# Patient Record
Sex: Female | Born: 1965 | Race: White | Hispanic: No | Marital: Single | Smoking: Never smoker
Health system: Southern US, Community
[De-identification: ages and names within clinical notes are randomized; demographics above are authoritative.]

## PROBLEM LIST (undated history)

## (undated) DIAGNOSIS — R0600 Dyspnea, unspecified: Secondary | ICD-10-CM

## (undated) DIAGNOSIS — N939 Abnormal uterine and vaginal bleeding, unspecified: Secondary | ICD-10-CM

## (undated) DIAGNOSIS — F32A Depression, unspecified: Secondary | ICD-10-CM

## (undated) DIAGNOSIS — J45909 Unspecified asthma, uncomplicated: Secondary | ICD-10-CM

## (undated) DIAGNOSIS — D649 Anemia, unspecified: Secondary | ICD-10-CM

## (undated) DIAGNOSIS — E1169 Type 2 diabetes mellitus with other specified complication: Secondary | ICD-10-CM

## (undated) DIAGNOSIS — F419 Anxiety disorder, unspecified: Secondary | ICD-10-CM

## (undated) DIAGNOSIS — M1990 Unspecified osteoarthritis, unspecified site: Secondary | ICD-10-CM

## (undated) DIAGNOSIS — R7303 Prediabetes: Secondary | ICD-10-CM

## (undated) DIAGNOSIS — E669 Obesity, unspecified: Secondary | ICD-10-CM

## (undated) HISTORY — DX: Obesity, unspecified: E66.9

## (undated) HISTORY — DX: Unspecified asthma, uncomplicated: J45.909

## (undated) HISTORY — DX: Obesity, unspecified: E11.69

---

## 1998-07-05 ENCOUNTER — Inpatient Hospital Stay (HOSPITAL_COMMUNITY): Admission: AD | Admit: 1998-07-05 | Discharge: 1998-07-08 | Payer: Self-pay | Admitting: Obstetrics and Gynecology

## 2004-12-09 ENCOUNTER — Other Ambulatory Visit: Admission: RE | Admit: 2004-12-09 | Discharge: 2004-12-09 | Payer: Self-pay | Admitting: Family Medicine

## 2005-12-18 ENCOUNTER — Other Ambulatory Visit: Admission: RE | Admit: 2005-12-18 | Discharge: 2005-12-18 | Payer: Self-pay | Admitting: Family Medicine

## 2006-08-14 ENCOUNTER — Encounter: Admission: RE | Admit: 2006-08-14 | Discharge: 2006-08-14 | Payer: Self-pay | Admitting: Family Medicine

## 2006-12-21 ENCOUNTER — Other Ambulatory Visit: Admission: RE | Admit: 2006-12-21 | Discharge: 2006-12-21 | Payer: Self-pay | Admitting: Family Medicine

## 2007-09-24 ENCOUNTER — Encounter: Admission: RE | Admit: 2007-09-24 | Discharge: 2007-09-24 | Payer: Self-pay | Admitting: Family Medicine

## 2008-02-03 ENCOUNTER — Other Ambulatory Visit: Admission: RE | Admit: 2008-02-03 | Discharge: 2008-02-03 | Payer: Self-pay | Admitting: Family Medicine

## 2009-08-06 ENCOUNTER — Other Ambulatory Visit: Admission: RE | Admit: 2009-08-06 | Discharge: 2009-08-06 | Payer: Self-pay | Admitting: Family Medicine

## 2009-09-28 ENCOUNTER — Encounter: Admission: RE | Admit: 2009-09-28 | Discharge: 2009-09-28 | Payer: Self-pay | Admitting: Family Medicine

## 2010-03-21 ENCOUNTER — Ambulatory Visit
Admission: RE | Admit: 2010-03-21 | Discharge: 2010-03-21 | Disposition: A | Discharge status: Home / Self Care | Payer: BC Managed Care – PPO | Source: Ambulatory Visit | Attending: Family Medicine | Admitting: Family Medicine

## 2010-03-21 ENCOUNTER — Other Ambulatory Visit: Payer: Self-pay | Admitting: Family Medicine

## 2010-03-21 DIAGNOSIS — R059 Cough, unspecified: Secondary | ICD-10-CM

## 2010-03-21 DIAGNOSIS — R05 Cough: Secondary | ICD-10-CM

## 2010-08-12 ENCOUNTER — Other Ambulatory Visit: Payer: Self-pay | Admitting: Family Medicine

## 2010-08-12 ENCOUNTER — Other Ambulatory Visit (HOSPITAL_COMMUNITY)
Admission: RE | Admit: 2010-08-12 | Discharge: 2010-08-12 | Disposition: A | Discharge status: Home / Self Care | Payer: BC Managed Care – PPO | Source: Ambulatory Visit | Attending: Family Medicine | Admitting: Family Medicine

## 2010-08-12 DIAGNOSIS — Z124 Encounter for screening for malignant neoplasm of cervix: Secondary | ICD-10-CM | POA: Insufficient documentation

## 2010-12-17 ENCOUNTER — Other Ambulatory Visit: Payer: Self-pay | Admitting: Family Medicine

## 2010-12-17 DIAGNOSIS — Z1231 Encounter for screening mammogram for malignant neoplasm of breast: Secondary | ICD-10-CM

## 2011-01-09 ENCOUNTER — Ambulatory Visit
Admission: RE | Admit: 2011-01-09 | Discharge: 2011-01-09 | Disposition: A | Discharge status: Home / Self Care | Payer: BC Managed Care – PPO | Source: Ambulatory Visit | Attending: Family Medicine | Admitting: Family Medicine

## 2011-01-09 DIAGNOSIS — Z1231 Encounter for screening mammogram for malignant neoplasm of breast: Secondary | ICD-10-CM

## 2012-08-10 ENCOUNTER — Other Ambulatory Visit: Payer: Self-pay | Admitting: Obstetrics and Gynecology

## 2012-08-10 ENCOUNTER — Other Ambulatory Visit (HOSPITAL_COMMUNITY)
Admission: RE | Admit: 2012-08-10 | Discharge: 2012-08-10 | Disposition: A | Discharge status: Home / Self Care | Payer: BC Managed Care – PPO | Source: Ambulatory Visit | Attending: Obstetrics and Gynecology | Admitting: Obstetrics and Gynecology

## 2012-08-10 DIAGNOSIS — Z01419 Encounter for gynecological examination (general) (routine) without abnormal findings: Secondary | ICD-10-CM | POA: Insufficient documentation

## 2012-08-10 DIAGNOSIS — Z1151 Encounter for screening for human papillomavirus (HPV): Secondary | ICD-10-CM | POA: Insufficient documentation

## 2012-09-09 ENCOUNTER — Other Ambulatory Visit: Payer: Self-pay

## 2012-09-09 DIAGNOSIS — Z1231 Encounter for screening mammogram for malignant neoplasm of breast: Secondary | ICD-10-CM

## 2012-10-01 ENCOUNTER — Ambulatory Visit: Payer: BC Managed Care – PPO

## 2012-10-20 ENCOUNTER — Ambulatory Visit
Admission: RE | Admit: 2012-10-20 | Discharge: 2012-10-20 | Disposition: A | Discharge status: Home / Self Care | Payer: BC Managed Care – PPO | Source: Ambulatory Visit

## 2012-10-20 DIAGNOSIS — Z1231 Encounter for screening mammogram for malignant neoplasm of breast: Secondary | ICD-10-CM

## 2013-10-24 ENCOUNTER — Other Ambulatory Visit (HOSPITAL_COMMUNITY): Payer: Self-pay | Admitting: Respiratory Therapy

## 2013-10-24 ENCOUNTER — Ambulatory Visit (HOSPITAL_COMMUNITY)
Admission: RE | Admit: 2013-10-24 | Discharge: 2013-10-24 | Disposition: A | Discharge status: Home / Self Care | Payer: BC Managed Care – PPO | Source: Ambulatory Visit | Attending: Family Medicine | Admitting: Family Medicine

## 2013-10-24 DIAGNOSIS — R0600 Dyspnea, unspecified: Secondary | ICD-10-CM | POA: Insufficient documentation

## 2013-10-24 DIAGNOSIS — J45909 Unspecified asthma, uncomplicated: Secondary | ICD-10-CM

## 2013-10-24 LAB — PULMONARY FUNCTION TEST
DL/VA % PRED: 117 %
DL/VA: 5.5 ml/min/mmHg/L
DLCO COR % PRED: 105 %
DLCO COR: 24.13 ml/min/mmHg
DLCO unc % pred: 105 %
DLCO unc: 24.13 ml/min/mmHg
FEF 25-75 PRE: 1.95 L/s
FEF 25-75 Post: 2.03 L/sec
FEF2575-%CHANGE-POST: 4 %
FEF2575-%PRED-POST: 71 %
FEF2575-%PRED-PRE: 69 %
FEV1-%CHANGE-POST: 2 %
FEV1-%PRED-PRE: 87 %
FEV1-%Pred-Post: 89 %
FEV1-PRE: 2.43 L
FEV1-Post: 2.48 L
FEV1FVC-%Change-Post: 5 %
FEV1FVC-%PRED-PRE: 94 %
FEV6-%CHANGE-POST: 0 %
FEV6-%PRED-POST: 91 %
FEV6-%Pred-Pre: 92 %
FEV6-Post: 3.1 L
FEV6-Pre: 3.12 L
FEV6FVC-%Change-Post: 1 %
FEV6FVC-%Pred-Post: 102 %
FEV6FVC-%Pred-Pre: 101 %
FVC-%Change-Post: -2 %
FVC-%PRED-POST: 89 %
FVC-%Pred-Pre: 91 %
FVC-POST: 3.1 L
FVC-PRE: 3.19 L
POST FEV1/FVC RATIO: 80 %
Post FEV6/FVC ratio: 100 %
Pre FEV1/FVC ratio: 76 %
Pre FEV6/FVC Ratio: 98 %
RV % pred: 90 %
RV: 1.51 L
TLC % pred: 102 %
TLC: 5.04 L

## 2013-10-24 MED ORDER — ALBUTEROL SULFATE (2.5 MG/3ML) 0.083% IN NEBU
2.5000 mg | INHALATION_SOLUTION | Freq: Once | RESPIRATORY_TRACT | Status: AC
  Administered 2013-10-24: 2.5 mg via RESPIRATORY_TRACT

## 2014-10-24 ENCOUNTER — Other Ambulatory Visit (HOSPITAL_COMMUNITY)
Admission: RE | Admit: 2014-10-24 | Discharge: 2014-10-24 | Disposition: A | Discharge status: Home / Self Care | Payer: 59 | Source: Ambulatory Visit | Attending: Family Medicine | Admitting: Family Medicine

## 2014-10-24 ENCOUNTER — Other Ambulatory Visit: Payer: Self-pay | Admitting: Family Medicine

## 2014-10-24 DIAGNOSIS — Z124 Encounter for screening for malignant neoplasm of cervix: Secondary | ICD-10-CM | POA: Insufficient documentation

## 2014-10-26 LAB — CYTOLOGY - PAP

## 2014-10-31 ENCOUNTER — Other Ambulatory Visit: Payer: Self-pay

## 2014-10-31 DIAGNOSIS — Z1231 Encounter for screening mammogram for malignant neoplasm of breast: Secondary | ICD-10-CM

## 2014-11-17 ENCOUNTER — Ambulatory Visit
Admission: RE | Admit: 2014-11-17 | Discharge: 2014-11-17 | Disposition: A | Discharge status: Home / Self Care | Payer: 59 | Source: Ambulatory Visit

## 2014-11-17 DIAGNOSIS — Z1231 Encounter for screening mammogram for malignant neoplasm of breast: Secondary | ICD-10-CM

## 2015-11-12 DIAGNOSIS — E7800 Pure hypercholesterolemia, unspecified: Secondary | ICD-10-CM | POA: Diagnosis not present

## 2015-11-12 DIAGNOSIS — K219 Gastro-esophageal reflux disease without esophagitis: Secondary | ICD-10-CM | POA: Diagnosis not present

## 2015-11-12 DIAGNOSIS — Z0000 Encounter for general adult medical examination without abnormal findings: Secondary | ICD-10-CM | POA: Diagnosis not present

## 2015-11-12 DIAGNOSIS — Z23 Encounter for immunization: Secondary | ICD-10-CM | POA: Diagnosis not present

## 2015-11-12 DIAGNOSIS — R739 Hyperglycemia, unspecified: Secondary | ICD-10-CM | POA: Diagnosis not present

## 2015-11-12 DIAGNOSIS — J309 Allergic rhinitis, unspecified: Secondary | ICD-10-CM | POA: Diagnosis not present

## 2015-11-16 DIAGNOSIS — N951 Menopausal and female climacteric states: Secondary | ICD-10-CM | POA: Diagnosis not present

## 2016-02-26 DIAGNOSIS — J45909 Unspecified asthma, uncomplicated: Secondary | ICD-10-CM | POA: Diagnosis not present

## 2016-03-12 ENCOUNTER — Other Ambulatory Visit: Payer: Self-pay | Admitting: Physician Assistant

## 2016-03-12 ENCOUNTER — Ambulatory Visit
Admission: RE | Admit: 2016-03-12 | Discharge: 2016-03-12 | Disposition: A | Discharge status: Home / Self Care | Payer: BLUE CROSS/BLUE SHIELD | Source: Ambulatory Visit | Attending: Physician Assistant | Admitting: Physician Assistant

## 2016-03-12 DIAGNOSIS — J45909 Unspecified asthma, uncomplicated: Secondary | ICD-10-CM | POA: Diagnosis not present

## 2016-03-12 DIAGNOSIS — R05 Cough: Secondary | ICD-10-CM

## 2016-03-12 DIAGNOSIS — R059 Cough, unspecified: Secondary | ICD-10-CM

## 2016-05-19 DIAGNOSIS — R739 Hyperglycemia, unspecified: Secondary | ICD-10-CM | POA: Diagnosis not present

## 2016-08-26 DIAGNOSIS — H6092 Unspecified otitis externa, left ear: Secondary | ICD-10-CM | POA: Diagnosis not present

## 2016-09-07 DIAGNOSIS — B354 Tinea corporis: Secondary | ICD-10-CM | POA: Diagnosis not present

## 2016-09-07 DIAGNOSIS — L309 Dermatitis, unspecified: Secondary | ICD-10-CM | POA: Diagnosis not present

## 2016-11-12 DIAGNOSIS — E7800 Pure hypercholesterolemia, unspecified: Secondary | ICD-10-CM | POA: Diagnosis not present

## 2016-11-12 DIAGNOSIS — Z23 Encounter for immunization: Secondary | ICD-10-CM | POA: Diagnosis not present

## 2016-11-12 DIAGNOSIS — Z0000 Encounter for general adult medical examination without abnormal findings: Secondary | ICD-10-CM | POA: Diagnosis not present

## 2016-11-12 DIAGNOSIS — R739 Hyperglycemia, unspecified: Secondary | ICD-10-CM | POA: Diagnosis not present

## 2016-11-20 ENCOUNTER — Other Ambulatory Visit: Payer: Self-pay | Admitting: Family Medicine

## 2016-11-20 DIAGNOSIS — Z1231 Encounter for screening mammogram for malignant neoplasm of breast: Secondary | ICD-10-CM

## 2016-12-17 ENCOUNTER — Ambulatory Visit: Payer: BLUE CROSS/BLUE SHIELD

## 2016-12-23 DIAGNOSIS — J069 Acute upper respiratory infection, unspecified: Secondary | ICD-10-CM | POA: Diagnosis not present

## 2017-01-14 ENCOUNTER — Ambulatory Visit: Payer: BLUE CROSS/BLUE SHIELD

## 2017-02-09 ENCOUNTER — Ambulatory Visit
Admission: RE | Admit: 2017-02-09 | Discharge: 2017-02-09 | Disposition: A | Discharge status: Home / Self Care | Payer: BLUE CROSS/BLUE SHIELD | Source: Ambulatory Visit | Attending: Family Medicine | Admitting: Family Medicine

## 2017-02-09 DIAGNOSIS — Z1231 Encounter for screening mammogram for malignant neoplasm of breast: Secondary | ICD-10-CM | POA: Diagnosis not present

## 2017-03-31 DIAGNOSIS — I10 Essential (primary) hypertension: Secondary | ICD-10-CM | POA: Diagnosis not present

## 2017-03-31 DIAGNOSIS — J069 Acute upper respiratory infection, unspecified: Secondary | ICD-10-CM | POA: Diagnosis not present

## 2017-03-31 DIAGNOSIS — J0100 Acute maxillary sinusitis, unspecified: Secondary | ICD-10-CM | POA: Diagnosis not present

## 2017-11-20 ENCOUNTER — Other Ambulatory Visit (HOSPITAL_COMMUNITY)
Admission: RE | Admit: 2017-11-20 | Discharge: 2017-11-20 | Disposition: A | Discharge status: Home / Self Care | Payer: BLUE CROSS/BLUE SHIELD | Source: Ambulatory Visit | Attending: Family Medicine | Admitting: Family Medicine

## 2017-11-20 ENCOUNTER — Other Ambulatory Visit: Payer: Self-pay | Admitting: Family Medicine

## 2017-11-20 DIAGNOSIS — Z124 Encounter for screening for malignant neoplasm of cervix: Secondary | ICD-10-CM | POA: Diagnosis not present

## 2017-11-20 DIAGNOSIS — Z23 Encounter for immunization: Secondary | ICD-10-CM | POA: Diagnosis not present

## 2017-11-20 DIAGNOSIS — Z0000 Encounter for general adult medical examination without abnormal findings: Secondary | ICD-10-CM | POA: Diagnosis not present

## 2017-11-20 DIAGNOSIS — R739 Hyperglycemia, unspecified: Secondary | ICD-10-CM | POA: Diagnosis not present

## 2017-11-20 DIAGNOSIS — E7800 Pure hypercholesterolemia, unspecified: Secondary | ICD-10-CM | POA: Diagnosis not present

## 2017-11-26 LAB — CYTOLOGY - PAP: DIAGNOSIS: NEGATIVE

## 2017-12-07 DIAGNOSIS — N951 Menopausal and female climacteric states: Secondary | ICD-10-CM | POA: Diagnosis not present

## 2017-12-11 DIAGNOSIS — Z01818 Encounter for other preprocedural examination: Secondary | ICD-10-CM | POA: Diagnosis not present

## 2017-12-11 DIAGNOSIS — Z1211 Encounter for screening for malignant neoplasm of colon: Secondary | ICD-10-CM | POA: Diagnosis not present

## 2018-01-20 HISTORY — PX: BACK SURGERY: SHX140

## 2018-02-02 DIAGNOSIS — Z1211 Encounter for screening for malignant neoplasm of colon: Secondary | ICD-10-CM | POA: Diagnosis not present

## 2018-02-02 DIAGNOSIS — Z8371 Family history of colonic polyps: Secondary | ICD-10-CM | POA: Diagnosis not present

## 2018-02-11 DIAGNOSIS — J22 Unspecified acute lower respiratory infection: Secondary | ICD-10-CM | POA: Diagnosis not present

## 2018-02-11 DIAGNOSIS — R05 Cough: Secondary | ICD-10-CM | POA: Diagnosis not present

## 2018-02-22 DIAGNOSIS — J45909 Unspecified asthma, uncomplicated: Secondary | ICD-10-CM | POA: Diagnosis not present

## 2018-04-01 DIAGNOSIS — J309 Allergic rhinitis, unspecified: Secondary | ICD-10-CM | POA: Diagnosis not present

## 2018-04-01 DIAGNOSIS — J45909 Unspecified asthma, uncomplicated: Secondary | ICD-10-CM | POA: Diagnosis not present

## 2018-04-01 DIAGNOSIS — M5430 Sciatica, unspecified side: Secondary | ICD-10-CM | POA: Diagnosis not present

## 2018-04-09 DIAGNOSIS — Z209 Contact with and (suspected) exposure to unspecified communicable disease: Secondary | ICD-10-CM | POA: Diagnosis not present

## 2018-04-09 DIAGNOSIS — J45909 Unspecified asthma, uncomplicated: Secondary | ICD-10-CM | POA: Diagnosis not present

## 2018-04-09 DIAGNOSIS — J309 Allergic rhinitis, unspecified: Secondary | ICD-10-CM | POA: Diagnosis not present

## 2018-05-21 DIAGNOSIS — J45909 Unspecified asthma, uncomplicated: Secondary | ICD-10-CM | POA: Diagnosis not present

## 2018-05-21 DIAGNOSIS — J309 Allergic rhinitis, unspecified: Secondary | ICD-10-CM | POA: Diagnosis not present

## 2018-05-21 DIAGNOSIS — E7800 Pure hypercholesterolemia, unspecified: Secondary | ICD-10-CM | POA: Diagnosis not present

## 2018-05-21 DIAGNOSIS — R7303 Prediabetes: Secondary | ICD-10-CM | POA: Diagnosis not present

## 2018-05-26 DIAGNOSIS — R7303 Prediabetes: Secondary | ICD-10-CM | POA: Diagnosis not present

## 2018-05-26 DIAGNOSIS — E7800 Pure hypercholesterolemia, unspecified: Secondary | ICD-10-CM | POA: Diagnosis not present

## 2018-07-05 ENCOUNTER — Other Ambulatory Visit: Payer: Self-pay | Admitting: Family Medicine

## 2018-07-05 DIAGNOSIS — M5410 Radiculopathy, site unspecified: Secondary | ICD-10-CM

## 2018-07-23 ENCOUNTER — Ambulatory Visit
Admission: RE | Admit: 2018-07-23 | Discharge: 2018-07-23 | Disposition: A | Discharge status: Home / Self Care | Payer: BLUE CROSS/BLUE SHIELD | Source: Ambulatory Visit | Attending: Family Medicine | Admitting: Family Medicine

## 2018-07-23 ENCOUNTER — Other Ambulatory Visit: Payer: Self-pay

## 2018-07-23 DIAGNOSIS — M5410 Radiculopathy, site unspecified: Secondary | ICD-10-CM

## 2018-07-23 DIAGNOSIS — M48061 Spinal stenosis, lumbar region without neurogenic claudication: Secondary | ICD-10-CM | POA: Diagnosis not present

## 2018-08-19 DIAGNOSIS — M5126 Other intervertebral disc displacement, lumbar region: Secondary | ICD-10-CM | POA: Diagnosis not present

## 2018-09-16 DIAGNOSIS — Z1159 Encounter for screening for other viral diseases: Secondary | ICD-10-CM | POA: Diagnosis not present

## 2018-09-22 DIAGNOSIS — M5116 Intervertebral disc disorders with radiculopathy, lumbar region: Secondary | ICD-10-CM | POA: Diagnosis not present

## 2018-09-22 DIAGNOSIS — M4726 Other spondylosis with radiculopathy, lumbar region: Secondary | ICD-10-CM | POA: Diagnosis not present

## 2018-09-22 DIAGNOSIS — M5126 Other intervertebral disc displacement, lumbar region: Secondary | ICD-10-CM | POA: Diagnosis not present

## 2018-11-03 DIAGNOSIS — M545 Low back pain: Secondary | ICD-10-CM | POA: Diagnosis not present

## 2018-11-03 DIAGNOSIS — M4726 Other spondylosis with radiculopathy, lumbar region: Secondary | ICD-10-CM | POA: Diagnosis not present

## 2018-11-03 DIAGNOSIS — M5126 Other intervertebral disc displacement, lumbar region: Secondary | ICD-10-CM | POA: Diagnosis not present

## 2018-11-09 DIAGNOSIS — M4726 Other spondylosis with radiculopathy, lumbar region: Secondary | ICD-10-CM | POA: Diagnosis not present

## 2018-11-09 DIAGNOSIS — M5126 Other intervertebral disc displacement, lumbar region: Secondary | ICD-10-CM | POA: Diagnosis not present

## 2018-11-09 DIAGNOSIS — M545 Low back pain: Secondary | ICD-10-CM | POA: Diagnosis not present

## 2018-11-11 DIAGNOSIS — M545 Low back pain: Secondary | ICD-10-CM | POA: Diagnosis not present

## 2018-11-11 DIAGNOSIS — M4726 Other spondylosis with radiculopathy, lumbar region: Secondary | ICD-10-CM | POA: Diagnosis not present

## 2018-11-11 DIAGNOSIS — M5126 Other intervertebral disc displacement, lumbar region: Secondary | ICD-10-CM | POA: Diagnosis not present

## 2018-11-15 DIAGNOSIS — M4726 Other spondylosis with radiculopathy, lumbar region: Secondary | ICD-10-CM | POA: Diagnosis not present

## 2018-11-15 DIAGNOSIS — M5126 Other intervertebral disc displacement, lumbar region: Secondary | ICD-10-CM | POA: Diagnosis not present

## 2018-11-15 DIAGNOSIS — M545 Low back pain: Secondary | ICD-10-CM | POA: Diagnosis not present

## 2018-11-17 DIAGNOSIS — M545 Low back pain: Secondary | ICD-10-CM | POA: Diagnosis not present

## 2018-11-17 DIAGNOSIS — M5126 Other intervertebral disc displacement, lumbar region: Secondary | ICD-10-CM | POA: Diagnosis not present

## 2018-11-17 DIAGNOSIS — M4726 Other spondylosis with radiculopathy, lumbar region: Secondary | ICD-10-CM | POA: Diagnosis not present

## 2018-11-23 DIAGNOSIS — M4726 Other spondylosis with radiculopathy, lumbar region: Secondary | ICD-10-CM | POA: Diagnosis not present

## 2018-11-23 DIAGNOSIS — M545 Low back pain: Secondary | ICD-10-CM | POA: Diagnosis not present

## 2018-11-23 DIAGNOSIS — M5126 Other intervertebral disc displacement, lumbar region: Secondary | ICD-10-CM | POA: Diagnosis not present

## 2018-11-24 DIAGNOSIS — Z23 Encounter for immunization: Secondary | ICD-10-CM | POA: Diagnosis not present

## 2018-11-24 DIAGNOSIS — Z0000 Encounter for general adult medical examination without abnormal findings: Secondary | ICD-10-CM | POA: Diagnosis not present

## 2018-11-24 DIAGNOSIS — R7303 Prediabetes: Secondary | ICD-10-CM | POA: Diagnosis not present

## 2018-11-24 DIAGNOSIS — E7800 Pure hypercholesterolemia, unspecified: Secondary | ICD-10-CM | POA: Diagnosis not present

## 2018-11-25 DIAGNOSIS — M5126 Other intervertebral disc displacement, lumbar region: Secondary | ICD-10-CM | POA: Diagnosis not present

## 2018-11-25 DIAGNOSIS — M4726 Other spondylosis with radiculopathy, lumbar region: Secondary | ICD-10-CM | POA: Diagnosis not present

## 2018-11-25 DIAGNOSIS — M545 Low back pain: Secondary | ICD-10-CM | POA: Diagnosis not present

## 2018-12-02 DIAGNOSIS — M4726 Other spondylosis with radiculopathy, lumbar region: Secondary | ICD-10-CM | POA: Diagnosis not present

## 2018-12-02 DIAGNOSIS — M545 Low back pain: Secondary | ICD-10-CM | POA: Diagnosis not present

## 2018-12-02 DIAGNOSIS — M5126 Other intervertebral disc displacement, lumbar region: Secondary | ICD-10-CM | POA: Diagnosis not present

## 2018-12-06 DIAGNOSIS — M5126 Other intervertebral disc displacement, lumbar region: Secondary | ICD-10-CM | POA: Diagnosis not present

## 2018-12-06 DIAGNOSIS — M545 Low back pain: Secondary | ICD-10-CM | POA: Diagnosis not present

## 2018-12-06 DIAGNOSIS — M4726 Other spondylosis with radiculopathy, lumbar region: Secondary | ICD-10-CM | POA: Diagnosis not present

## 2018-12-08 DIAGNOSIS — M4726 Other spondylosis with radiculopathy, lumbar region: Secondary | ICD-10-CM | POA: Diagnosis not present

## 2018-12-08 DIAGNOSIS — M545 Low back pain: Secondary | ICD-10-CM | POA: Diagnosis not present

## 2018-12-08 DIAGNOSIS — M5126 Other intervertebral disc displacement, lumbar region: Secondary | ICD-10-CM | POA: Diagnosis not present

## 2019-03-01 ENCOUNTER — Other Ambulatory Visit: Payer: Self-pay | Admitting: Family Medicine

## 2019-03-01 DIAGNOSIS — Z1231 Encounter for screening mammogram for malignant neoplasm of breast: Secondary | ICD-10-CM

## 2019-03-01 DIAGNOSIS — R234 Changes in skin texture: Secondary | ICD-10-CM

## 2019-03-02 DIAGNOSIS — N951 Menopausal and female climacteric states: Secondary | ICD-10-CM | POA: Diagnosis not present

## 2019-03-14 ENCOUNTER — Ambulatory Visit
Admission: RE | Admit: 2019-03-14 | Discharge: 2019-03-14 | Disposition: A | Discharge status: Home / Self Care | Payer: BC Managed Care – PPO | Source: Ambulatory Visit | Attending: Family Medicine | Admitting: Family Medicine

## 2019-03-14 ENCOUNTER — Other Ambulatory Visit: Payer: Self-pay

## 2019-03-14 DIAGNOSIS — R928 Other abnormal and inconclusive findings on diagnostic imaging of breast: Secondary | ICD-10-CM | POA: Diagnosis not present

## 2019-03-14 DIAGNOSIS — R234 Changes in skin texture: Secondary | ICD-10-CM

## 2019-03-14 DIAGNOSIS — N6489 Other specified disorders of breast: Secondary | ICD-10-CM | POA: Diagnosis not present

## 2019-04-11 DIAGNOSIS — L299 Pruritus, unspecified: Secondary | ICD-10-CM | POA: Diagnosis not present

## 2019-04-17 ENCOUNTER — Ambulatory Visit: Payer: BC Managed Care – PPO | Attending: Internal Medicine

## 2019-04-17 DIAGNOSIS — Z23 Encounter for immunization: Secondary | ICD-10-CM

## 2019-04-17 NOTE — Progress Notes (Signed)
   Covid-19 Vaccination Clinic  Name:  KEIRRA ZEIMET    MRN: [id number] DOB: 01-04-1966  04/17/2019  Ms. Yorks was observed post Covid-19 immunization for 15 minutes without incident. She was provided with Vaccine Information Sheet and instruction to access the V-Safe system.   Ms. Reeser was instructed to call 911 with any severe reactions post vaccine: Marland Kitchen Difficulty breathing  . Swelling of face and throat  . A fast heartbeat  . A bad rash all over body  . Dizziness and weakness   Immunizations Administered    Name Date Dose VIS Date Route   Pfizer COVID-19 Vaccine 04/17/2019  1:57 PM 0.3 mL 12/31/2018 Intramuscular   Manufacturer: [employer], [employer]   Lot: [id number]   NDC: [address]-4

## 2019-05-10 ENCOUNTER — Ambulatory Visit: Payer: BC Managed Care – PPO | Attending: Internal Medicine

## 2019-05-10 DIAGNOSIS — Z23 Encounter for immunization: Secondary | ICD-10-CM

## 2019-05-10 NOTE — Progress Notes (Signed)
   Covid-19 Vaccination Clinic  Name:  Tracey Figueroa    MRN: [id number] DOB: [birth year]  05/10/2019  Tracey Figueroa was observed post Covid-19 immunization for 15 minutes without incident. She was provided with Vaccine Information Sheet and instruction to access the V-Safe system.   Tracey Figueroa was instructed to call 911 with any severe reactions post vaccine: Marland Kitchen Difficulty breathing  . Swelling of face and throat  . A fast heartbeat  . A bad rash all over body  . Dizziness and weakness   Immunizations Administered    Name Date Dose VIS Date Route   Pfizer COVID-19 Vaccine 05/10/2019  2:26 PM 0.3 mL 03/16/2018 Intramuscular   Manufacturer: [employer], [employer]   Lot: [id number]   NDC: [address]-3

## 2019-05-24 DIAGNOSIS — L509 Urticaria, unspecified: Secondary | ICD-10-CM | POA: Diagnosis not present

## 2019-05-24 DIAGNOSIS — E7800 Pure hypercholesterolemia, unspecified: Secondary | ICD-10-CM | POA: Diagnosis not present

## 2019-05-24 DIAGNOSIS — R7303 Prediabetes: Secondary | ICD-10-CM | POA: Diagnosis not present

## 2019-11-12 DIAGNOSIS — Z20822 Contact with and (suspected) exposure to covid-19: Secondary | ICD-10-CM | POA: Diagnosis not present

## 2019-11-28 DIAGNOSIS — Z0000 Encounter for general adult medical examination without abnormal findings: Secondary | ICD-10-CM | POA: Diagnosis not present

## 2019-11-28 DIAGNOSIS — N951 Menopausal and female climacteric states: Secondary | ICD-10-CM | POA: Diagnosis not present

## 2019-11-28 DIAGNOSIS — E7800 Pure hypercholesterolemia, unspecified: Secondary | ICD-10-CM | POA: Diagnosis not present

## 2019-11-28 DIAGNOSIS — R7303 Prediabetes: Secondary | ICD-10-CM | POA: Diagnosis not present

## 2019-11-28 DIAGNOSIS — Z23 Encounter for immunization: Secondary | ICD-10-CM | POA: Diagnosis not present

## 2020-01-10 DIAGNOSIS — E1169 Type 2 diabetes mellitus with other specified complication: Secondary | ICD-10-CM | POA: Diagnosis not present

## 2020-02-19 DIAGNOSIS — U071 COVID-19: Secondary | ICD-10-CM

## 2020-02-19 HISTORY — DX: COVID-19: U07.1

## 2020-02-20 DIAGNOSIS — Z20822 Contact with and (suspected) exposure to covid-19: Secondary | ICD-10-CM | POA: Diagnosis not present

## 2020-02-20 DIAGNOSIS — U071 COVID-19: Secondary | ICD-10-CM | POA: Diagnosis not present

## 2020-02-22 ENCOUNTER — Encounter: Payer: Self-pay | Admitting: Physician Assistant

## 2020-02-22 ENCOUNTER — Telehealth (HOSPITAL_COMMUNITY): Payer: Self-pay | Admitting: Pharmacist

## 2020-02-22 ENCOUNTER — Other Ambulatory Visit: Payer: Self-pay | Admitting: Physician Assistant

## 2020-02-22 ENCOUNTER — Telehealth: Payer: Self-pay | Admitting: Physician Assistant

## 2020-02-22 DIAGNOSIS — E669 Obesity, unspecified: Secondary | ICD-10-CM | POA: Insufficient documentation

## 2020-02-22 DIAGNOSIS — J45909 Unspecified asthma, uncomplicated: Secondary | ICD-10-CM | POA: Insufficient documentation

## 2020-02-22 DIAGNOSIS — E1169 Type 2 diabetes mellitus with other specified complication: Secondary | ICD-10-CM | POA: Insufficient documentation

## 2020-02-22 MED ORDER — NIRMATRELVIR/RITONAVIR (PAXLOVID)TABLET: 3.0000 | ORAL_TABLET | Freq: Two times a day (BID) | ORAL | 0 refills | Status: AC

## 2020-02-22 MED FILL — PAXLOVID 20 X 150 MG & 10 X: 20 X 150 MG | 5 days supply | Qty: 30 | Fill #0

## 2020-02-22 NOTE — Progress Notes (Addendum)
Outpatient Oral COVID Treatment Note  I connected with Tracey Figueroa on 02/22/2020/4:50 PM by telephone and verified that I am speaking with the correct person using two identifiers.  I discussed the limitations, risks, security, and privacy concerns of performing an evaluation and management service by telephone and the availability of in person appointments. I also discussed with the patient that there may be a patient responsible charge related to this service. The patient expressed understanding and agreed to proceed.  Patient location: home  Provider location: office  Diagnosis: COVID-19 infection  Purpose of visit: Discussion of potential use of Molnupiravir or Paxlovid, a new treatment for mild to moderate COVID-19 viral infection in non-hospitalized patients.   Subjective: Patient is a 55 y.o. female who has been diagnosed with COVID 19 viral infection.  Their symptoms began on 1/30 with headache and cough.    History DMT2 Obesity Asthma  Not on File   Current Outpatient Medications:  .  nirmatrelvir/ritonavir EUA (PAXLOVID) TABS, Take 3 tablets by mouth 2 (two) times daily for 5 days. Take nirmatrelvir (150 mg) two tablet(s) twice daily for 5 days and ritonavir (100 mg) one tablet twice daily for 5 days., Disp: 30 tablet, Rfl: 0 .  ADVAIR DISKUS 100-50 MCG/DOSE AEPB, 1 puff 2 (two) times daily., Disp: , Rfl:  .  hydrOXYzine (ATARAX/VISTARIL) 25 MG tablet, SMARTSIG:1 Tablet(s) By Mouth 1 to 3 Times Daily PRN, Disp: , Rfl:  .  sertraline (ZOLOFT) 100 MG tablet, Take 100 mg by mouth daily., Disp: , Rfl:   Objective: Patient sound congested.  They are in no apparent distress.  Breathing is non labored.  Mood and behavior are normal.  Laboratory Data:  No results found for this or any previous visit (from the past 2160 hour(s)).   Assessment: 55 y.o. female with mild/moderate COVID 19 viral infection diagnosed on 1/31 at high risk for progression to severe COVID 19.  Plan:   This patient is a 55 y.o. female that meets the following criteria for Emergency Use Authorization of: Paxlovid 1. Age >12 yr AND > 40 kg 2. SARS-COV-2 positive test 3. Symptom onset < 5 days 4. Mild-to-moderate COVID disease with high risk for severe progression to hospitalization or death  I have spoken and communicated the following to the patient or parent/caregiver regarding: 1. Paxlovid is an unapproved drug that is authorized for use under an Emergency Use Authorization.  2. There are no adequate, approved, available products for the treatment of COVID-19 in adults who have mild-to-moderate COVID-19 and are at high risk for progressing to severe COVID-19, including hospitalization or death. 3. Other therapeutics are currently authorized. For additional information on all products authorized for treatment or prevention of COVID-19, please see [email or web address]-[email or web address]  4. There are benefits and risks of taking this treatment as outlined in the "Fact Sheet for Patients and Caregivers."  5. "Fact Sheet for Patients and Caregivers" was reviewed with patient. A hard copy will be provided to patient from pharmacy prior to the patient receiving treatment. 6. Patients should continue to self-isolate and use infection control measures (e.g., wear mask, isolate, social distance, avoid sharing personal items, clean and disinfect "high touch" surfaces, and frequent handwashing) according to CDC guidelines.  7. The patient or parent/caregiver has the option to accept or refuse treatment. 8. Patient medication history was reviewed for potential drug interactions:Interaction with home meds: Advair discus 9. Patient's GFR was calculated to be >60, and they were therefore prescribed Normal dose (GFR>60) - nirmatrelvir 150mg   tab (2 tablet) by mouth twice daily AND ritonavir 100mg  tab (1 tablet) by mouth twice  daily. Reviewed labs from [facility] done at [facility]. She had a creat of 0.74 on 11/28/19.    After reviewing above information with the patient, the patient agrees to receive Paxlovid.  Follow up instructions:    . Take prescription BID x 5 days as directed . Reach out to pharmacist for counseling on medication if desired . For concerns regarding further COVID symptoms please follow up with your PCP or urgent care . For urgent or life-threatening issues, seek care at your local emergency department  The patient was provided an opportunity to ask questions, and all were answered. The patient agreed with the plan and demonstrated an understanding of the instructions.   Script sent to [facility] and opted to pick up RX.  The patient was advised to call their PCP or seek an in-person evaluation if the symptoms worsen or if the condition fails to improve as anticipated.   I provided 20 minutes of non face-to-face telephone visit time during this encounter, and > 50% was spent counseling as documented under my assessment & plan.  [facility], PA-C 02/22/2020 /4:50 PM

## 2020-02-22 NOTE — Telephone Encounter (Signed)
Called to discuss with patient about COVID-19 symptoms and the use of one of the available treatments for those with mild to moderate Covid symptoms and at a high risk of hospitalization.  Pt appears to qualify for outpatient treatment due to co-morbid conditions and/or a member of an at-risk group in accordance with the FDA Emergency Use Authorization.    Symptom onset: 1/30- per referral info Vaccinated: yes Booster? unknown Immunocompromised? no Qualifiers: BMI>25, DMT2, asthma.   Unable to reach pt - left VM, text and mychart message. Could be an oral candidate. [facility] labs from 11/28/19 showed creat of 0.74.  Cline Crock

## 2020-02-22 NOTE — Telephone Encounter (Signed)
Patient was prescribed oral covid treatment paxlovid and treatment note was reviewed. Medication has been received by [facility] and reviewed for appropriateness.  Drug Interactions or Dosage Adjustments Noted: Patient's GFR was calculated to be greater than 50ml/min.  Patients reports she is not currently taking any medications.  Delivery Method: Patient will pick up  Patient contacted for counseling on 02/22/2020 and verbalized understanding.   Delivery or Pick-Up Date: 02/22/2020  Larrie Kass 02/22/2020, 4:50 PM [facility] Outpatient Pharmacist Phone# [phone]

## 2020-03-12 DIAGNOSIS — N939 Abnormal uterine and vaginal bleeding, unspecified: Secondary | ICD-10-CM | POA: Diagnosis not present

## 2020-03-20 DIAGNOSIS — J45909 Unspecified asthma, uncomplicated: Secondary | ICD-10-CM | POA: Diagnosis not present

## 2020-03-20 DIAGNOSIS — Z8619 Personal history of other infectious and parasitic diseases: Secondary | ICD-10-CM | POA: Diagnosis not present

## 2020-03-20 DIAGNOSIS — J0110 Acute frontal sinusitis, unspecified: Secondary | ICD-10-CM | POA: Diagnosis not present

## 2020-04-09 DIAGNOSIS — J45909 Unspecified asthma, uncomplicated: Secondary | ICD-10-CM | POA: Diagnosis not present

## 2020-04-09 DIAGNOSIS — E7800 Pure hypercholesterolemia, unspecified: Secondary | ICD-10-CM | POA: Diagnosis not present

## 2020-04-09 DIAGNOSIS — E1169 Type 2 diabetes mellitus with other specified complication: Secondary | ICD-10-CM | POA: Diagnosis not present

## 2020-04-09 DIAGNOSIS — J309 Allergic rhinitis, unspecified: Secondary | ICD-10-CM | POA: Diagnosis not present

## 2020-04-23 ENCOUNTER — Other Ambulatory Visit: Payer: Self-pay

## 2020-04-23 ENCOUNTER — Encounter (HOSPITAL_COMMUNITY): Payer: Self-pay | Admitting: Obstetrics and Gynecology

## 2020-04-23 ENCOUNTER — Other Ambulatory Visit: Payer: Self-pay | Admitting: Obstetrics and Gynecology

## 2020-04-23 ENCOUNTER — Observation Stay (HOSPITAL_COMMUNITY)
Admission: AD | Admit: 2020-04-23 | Discharge: 2020-04-24 | Disposition: A | Discharge status: Home / Self Care | Payer: BC Managed Care – PPO | Attending: Obstetrics and Gynecology | Admitting: Obstetrics and Gynecology

## 2020-04-23 ENCOUNTER — Observation Stay (HOSPITAL_COMMUNITY): Payer: BC Managed Care – PPO

## 2020-04-23 DIAGNOSIS — N939 Abnormal uterine and vaginal bleeding, unspecified: Secondary | ICD-10-CM | POA: Diagnosis not present

## 2020-04-23 DIAGNOSIS — D649 Anemia, unspecified: Secondary | ICD-10-CM | POA: Diagnosis present

## 2020-04-23 DIAGNOSIS — J45909 Unspecified asthma, uncomplicated: Secondary | ICD-10-CM | POA: Diagnosis not present

## 2020-04-23 DIAGNOSIS — D62 Acute posthemorrhagic anemia: Secondary | ICD-10-CM | POA: Insufficient documentation

## 2020-04-23 DIAGNOSIS — Z28310 Unvaccinated for covid-19: Secondary | ICD-10-CM | POA: Diagnosis not present

## 2020-04-23 DIAGNOSIS — R7303 Prediabetes: Secondary | ICD-10-CM | POA: Insufficient documentation

## 2020-04-23 LAB — ABO/RH: ABO/RH(D): O POS

## 2020-04-23 LAB — HEMOGLOBIN AND HEMATOCRIT, BLOOD
HCT: 22.1 % — ABNORMAL LOW (ref 36.0–46.0)
Hemoglobin: 6.3 g/dL — CL (ref 12.0–15.0)

## 2020-04-23 LAB — PREPARE RBC (CROSSMATCH)

## 2020-04-23 MED ORDER — FUROSEMIDE 10 MG/ML IJ SOLN
20.0000 mg | Freq: Once | INTRAMUSCULAR | Status: AC
  Administered 2020-04-23: 20 mg via INTRAVENOUS
  Filled 2020-04-23: qty 2

## 2020-04-23 MED ORDER — MOMETASONE FURO-FORMOTEROL FUM 100-5 MCG/ACT IN AERO
2.0000 | INHALATION_SPRAY | Freq: Two times a day (BID) | RESPIRATORY_TRACT | Status: DC
  Administered 2020-04-23 – 2020-04-24 (×2): 2 via RESPIRATORY_TRACT
  Filled 2020-04-23: qty 8.8

## 2020-04-23 MED ORDER — ONDANSETRON HCL 4 MG/2ML IJ SOLN: 4.0000 mg | Freq: Four times a day (QID) | INTRAMUSCULAR | Status: DC | PRN

## 2020-04-23 MED ORDER — ONDANSETRON HCL 4 MG PO TABS
4.0000 mg | ORAL_TABLET | Freq: Four times a day (QID) | ORAL | Status: DC | PRN
Start: 2020-04-23 — End: 2020-04-24

## 2020-04-23 MED ORDER — SIMETHICONE 80 MG PO CHEW: 80.0000 mg | CHEWABLE_TABLET | Freq: Four times a day (QID) | ORAL | Status: DC | PRN

## 2020-04-23 MED ORDER — SODIUM CHLORIDE 0.9% IV SOLUTION: Freq: Once | INTRAVENOUS | Status: AC

## 2020-04-23 MED ORDER — DIPHENHYDRAMINE HCL 50 MG/ML IJ SOLN
25.0000 mg | Freq: Once | INTRAMUSCULAR | Status: AC
  Administered 2020-04-23: 25 mg via INTRAVENOUS
  Filled 2020-04-23: qty 1

## 2020-04-23 MED ORDER — FUROSEMIDE 10 MG/ML IJ SOLN: 20.0000 mg | Freq: Once | INTRAMUSCULAR | Status: DC

## 2020-04-23 MED ORDER — M.V.I. ADULT IV INJ
Freq: Once | INTRAVENOUS | Status: DC
  Filled 2020-04-23: qty 1000

## 2020-04-23 MED ORDER — ACETAMINOPHEN 325 MG PO TABS
650.0000 mg | ORAL_TABLET | Freq: Once | ORAL | Status: AC
  Administered 2020-04-23: 650 mg via ORAL
  Filled 2020-04-23: qty 2

## 2020-04-23 MED ORDER — ACETAMINOPHEN 325 MG PO TABS
650.0000 mg | ORAL_TABLET | ORAL | Status: DC | PRN
  Administered 2020-04-24: 650 mg via ORAL
  Filled 2020-04-23: qty 2

## 2020-04-23 MED ORDER — ALUM & MAG HYDROXIDE-SIMETH 200-200-20 MG/5ML PO SUSP: 30.0000 mL | ORAL | Status: DC | PRN

## 2020-04-23 MED ORDER — ALBUTEROL SULFATE HFA 108 (90 BASE) MCG/ACT IN AERS: 2.0000 | INHALATION_SPRAY | RESPIRATORY_TRACT | Status: DC | PRN

## 2020-04-23 MED ORDER — TRANEXAMIC ACID 650 MG PO TABS
1300.0000 mg | ORAL_TABLET | Freq: Three times a day (TID) | ORAL | Status: DC
  Administered 2020-04-23 – 2020-04-24 (×3): 1300 mg via ORAL
  Filled 2020-04-23 (×7): qty 2

## 2020-04-23 MED ORDER — ZOLPIDEM TARTRATE 5 MG PO TABS: 5.0000 mg | ORAL_TABLET | Freq: Every evening | ORAL | Status: DC | PRN

## 2020-04-23 NOTE — Plan of Care (Signed)
  Problem: Education: Goal: Knowledge of General Education information will improve Description: Including pain rating scale, medication(s)/side effects and non-pharmacologic comfort measures Outcome: Completed/Met

## 2020-04-23 NOTE — H&P (Signed)
Subjective: Chief Complaint(s):   Abnormal Uterine bleeding/ Anemia HGb 6.5 in office 04/23/2020   HPI:  Isolation Precautions Has patient received COVID-19 vaccination? No. Does patient report new onset of COVID symptoms? No. Has patient or close contact tested positive for COVID-19? No , not in the past 2 weeks.  General 55 yo presents to discuss labs . GYN history from visit on 03/12/2020 shows that pt is a 55 y/o G2P2 female who presents for AUB. Starting July/August 2021 she started having heavy and long. Prior to this her periods were normal, and she was on birth control prior to this a few years ago.  Patient denied having any menopause signs or symptoms. No hot flashes, vaginal dryness or sleep disturbances. She is not sexually active and has not been x 16 years. Patient dis not know when her mother went into menopause. Patient last had an [place] in 2014, and she was found to have a few small fibroids measuring 1-2 cm in size.On 03/12/2020, Patient had an attempted EMB in the past, but could not tolerate it in the office. She stated the anxiety surrounding the procedure was significant. LMP 02/13/2020. During 03/12/2020, pt was still recovering from having had COVID 1/31. Pt rescheduled [place] and EMB to 04/06/2020. Pt did not show up to that appointment.  On 04/20/2020 pt called and complained of constant bleeding for the past 2 weeks and heavy for the past 2 days. THe blood is bright red. Pt has been changin ultra tampon every 3-4 hours. She passed small clots and is having cramping. Pt has had a period every month that normally lasts 3 wks. Dr. Connye Burkitt recommended that pt can come in for CBC lab prior her visit with Dr. Connye Burkitt on today on 04/23/2020. Dr. Connye Burkitt is out of office today. Pt came in for labs today. Critical lab value. HGB was 6.5 from 12.1 on 11/28/2019. Pt was called and counseled on acute blood loss anemia. Hematocrit 21.0. Platelets are 675. Previous hemoglobin in November was 12.1. Pt reports  that she currently has shortness of breath, but it is mostly at night. Pt was recommeded hospital admission for blood transfusion.  EMB performed in office today. Pt denies allergy to betadine iodine and shellfish. Current Medication: Taking  Juice Plus Fibre - Liquid as directed Orally.     Advair Diskus(Fluticasone-Salmeterol) 100-50 MCG/DOSE Aerosol Powder Breath Activated inhale 1 puff by mouth twice daily.     Proventil HFA(Albuterol Sulfate HFA) 108 (90 Base) MCG/ACT Aerosol Solution 1 puff Inhalation four times a day as needed.     Sertraline HCl 100 MG Tablet take 1 tablet by mouth every day Orally Once a day.     Voltaren(Diclofenac Sodium (Ophth)) 1 % Gel as directed Externally OTC.     Supplement: DoTerra Oil daily.     Ocean Nasal Mist(Sodium Chloride) 0.65 % Solution 2 drops in each nostril as needed Nasally once a day, Notes: prn.     Motrin IB(Ibuprofen) 200 MG Tablet 1 tablet with food or milk as needed Orally Three times a day.     Medication List reviewed and reconciled with the patient.  Medical History:  Asthma, Exercise induced     Carpel Tunnel syndrome, h/o     Prediabetes     elevated cholesterol     menopausal symptoms     depression/anxiety      Allergies/Intolerance: Erythromycin: Allergy - stomach upset Prednisone: Allergy - confusion Gyn History:  Sexual activity not currently sexually active. Periods :  every month, heavy. LMP 03/24/2020 - been heavy ever since. Birth control Junel FE. Last pap smear date 11/20/2017-neg. Last mammogram date 03/14/2019. Denies STD.   OB History:  Number of pregnancies 2. Pregnancy # 1 live birth, C-section delivery, girl. Pregnancy # 2 live birth, C-section, boy.   Surgical History:  C section x2     lumbar laminectomy 2020   Hospitalization:  childbirth x 2   Family History:  Father: deceased, , dementia, pre cancerous polyps removed, diagnosed with Hypertension    Mother: alive, arthritis, glaucoma,  diagnosed with Hypertension    Brother 1: alive 42 yrs, hypertension, precancerous polyps removed, diagnosed with Hypertension    1 brother(s) .    Denies any GYN family cancer HX. No Family History of Colon Cancer, or Liver Disease.  Social History: General Tobacco use cigarettes: Never smoked, Tobacco history last updated 04/23/2020, Vaping No.  no EXPOSURE TO PASSIVE SMOKE.  Alcohol: yes, occasionally.  no Recreational drug use.  Exercise: nothing structured.  Marital Status: single, widowed.  Children: Boys, 1, girls, 1.  OCCUPATION: Print production planner for [employer].  Seat belt use: yes.  ROS: CONSTITUTIONAL Chills yes. Fatigue yes. Fever yes. No" label="Night sweats" value="" options="no,yes" propid="91" itemid="193426" categoryid="10464" encounterid="[id number]"Night sweats No. No" label="Recent travel outside [place]" value="" options="no,yes" propid="91" itemid="444261" categoryid="10464" encounterid="[id number]"Recent travel outside [place] No. No" label="Sweats" value="" options="no,yes" propid="91" itemid="193427" categoryid="10464" encounterid="[id number]"Sweats No. Weight change yes.  OPHTHALMOLOGY no" label="Blurring of vision" value="" options="no,yes" propid="91" itemid="12520" categoryid="12516" encounterid="[id number]"Blurring of vision no. no" label="Change in vision" value="" options="no,yes" propid="91" itemid="193469" categoryid="12516" encounterid="[id number]"Change in vision no. no" label="Double vision" value="" options="no,yes" propid="91" itemid="194379" categoryid="12516" encounterid="[id number]"Double vision no.  ENT no" label="Dizziness" value="" options="no,yes" propid="91" itemid="193612" categoryid="10481" encounterid="[id number]"Dizziness no. Nose bleeds no. Sore throat no. Teeth pain no.  ALLERGY no" label="Hives" value="" options="no,yes" propid="91" itemid="202589" categoryid="138152" encounterid="[id number]"Hives no.  CARDIOLOGY no" label="Chest pain" value="" options="no,yes"  propid="91" itemid="193603" categoryid="10488" encounterid="[id number]"Chest pain no. High blood pressure yes. Irregular heart beat yes. no" label="Leg edema" value="" options="no,yes" propid="91" itemid="10491" categoryid="10488" encounterid="[id number]"Leg edema no. no" label="Palpitations" value="" options="no,yes" propid="91" itemid="10490" categoryid="10488" encounterid="[id number]"Palpitations no. Swelling of ankles yes.  RESPIRATORY no" label="Shortness of breath" value="" options="no" propid="91" itemid="270013" categoryid="138132" encounterid="[id number]"Shortness of breath no. no" label="Cough" value="" options="no,yes" propid="91" itemid="172745" categoryid="138132" encounterid="[id number]"Cough no. Wheezing yes.  UROLOGY no" label="Pain with urination" value="" options="no,yes" propid="91" itemid="194377" categoryid="138166" encounterid="[id number]"Pain with urination no. no" label="Urinary urgency" value="" options="no,yes" propid="91" itemid="193493" categoryid="138166" encounterid="[id number]"Urinary urgency no. no" label="Urinary frequency" value="" options="no,yes" propid="91" itemid="193492" categoryid="138166" encounterid="[id number]"Urinary frequency no. no" label="Urinary incontinence" value="" options="no,yes" propid="91" itemid="138171" categoryid="138166" encounterid="[id number]"Urinary incontinence no. No" label="Difficulty urinating" value="" options="no,yes" propid="91" itemid="138167" categoryid="138166" encounterid="[id number]"Difficulty urinating No. No" label="Blood in urine" value="" options="no,yes" propid="91" itemid="138168" categoryid="138166" encounterid="[id number]"Blood in urine No.  GASTROENTEROLOGY no" label="Abdominal pain" value="" options="no,yes" propid="91" itemid="10496" categoryid="10494" encounterid="[id number]"Abdominal pain no. no" label="Appetite change" value="" options="no,yes" propid="91" itemid="193447" categoryid="10494" encounterid="[id number]"Appetite change no. no"  label="Bloating/belching" value="" options="no,yes" propid="91" itemid="193448" categoryid="10494" encounterid="[id number]"Bloating/belching no. no" label="Blood in stool or on toilet paper" value="" options="no,yes" propid="91" itemid="10503" categoryid="10494" encounterid="[id number]"Blood in stool or on toilet paper no. no" label="Change in bowel movements" value="" options="no,yes" propid="91" itemid="199106" categoryid="10494" encounterid="[id number]"Change in bowel movements no. no" label="Constipation" value="" options="no,yes" propid="91" itemid="10501" categoryid="10494" encounterid="[id number]"Constipation no. no" label="Diarrhea" value="" options="no,yes" propid="91" itemid="10502" categoryid="10494" encounterid="[id number]"Diarrhea no. no" label="Difficulty swallowing" value="" options="no,yes" propid="91" itemid="199104" categoryid="10494" encounterid="[id number]"Difficulty swallowing no. no" label="Nausea" value="" options="no,yes" propid="91" itemid="10499" categoryid="10494" encounterid="[id number]"Nausea no.  FEMALE REPRODUCTIVE no" label="Vulvar pain" value="" options="no,yes" propid="91" itemid="453725" categoryid="10525" encounterid="[id number]"Vulvar pain no. no" label="Vulvar rash" value="" options="no,yes" propid="91" itemid="453726" categoryid="10525" encounterid="[id number]"Vulvar rash no. Abnormal vaginal bleeding yes. no" label="Breast pain" value="" options="no,yes" propid="91" itemid="186083" categoryid="10525"  encounterid="[id number]"Breast pain no. no" label="Nipple discharge" value="" options="no,yes" propid="91" itemid="186084" categoryid="10525" encounterid="[id number]"Nipple discharge no. no" label="Pain with intercourse" value="" options="no,yes" propid="91" itemid="275823" categoryid="10525" encounterid="[id number]"Pain with intercourse no. no" label="Pelvic pain" value="" options="no,yes" propid="91" itemid="186082" categoryid="10525" encounterid="[id number]"Pelvic pain no. no" label="Unusual vaginal  discharge" value="" options="no,yes" propid="91" itemid="278230" categoryid="10525" encounterid="[id number]"Unusual vaginal discharge no. Vaginal itching yes.  MUSCULOSKELETAL no" label="Muscle aches" value="" options="no,yes" propid="91" itemid="193461" categoryid="10514" encounterid="[id number]"Muscle aches no.  NEUROLOGY no" label="Headache" value="" options="no,yes" propid="91" itemid="12513" categoryid="12512" encounterid="[id number]"Headache no. Tingling/numbness yes. no" label="Weakness" value="" options="no,yes" propid="91" itemid="193468" categoryid="12512" encounterid="[id number]"Weakness no.  PSYCHOLOGY no" label="Depression" value="" options="" propid="91" itemid="275919" categoryid="10520" encounterid="[id number]"Depression no. Anxiety yes. Nervousness yes. Sleep disturbances yes. no " label="Suicidal ideation" value="" options="no,yes" propid="91" itemid="72718" categoryid="10520" encounterid="[id number]"Suicidal ideation no .  ENDOCRINOLOGY no" label="Excessive thirst" value="" options="no,yes" propid="91" itemid="194628" categoryid="12508" encounterid="[id number]"Excessive thirst no. no" label="Excessive urination" value="" options="no,yes" propid="91" itemid="196285" categoryid="12508" encounterid="[id number]"Excessive urination no. no" label="Hair loss" value="" options="no, yes" propid="91" itemid="444314" categoryid="12508" encounterid="[id number]"Hair loss no. no" label="Heat or cold intolerance" value="" options="" propid="91" itemid="447284" categoryid="12508" encounterid="[id number]"Heat or cold intolerance no.  HEMATOLOGY/LYMPH no" label="Abnormal bleeding" value="" options="no,yes" propid="91" itemid="199152" categoryid="138157" encounterid="[id number]"Abnormal bleeding no. no" label="Easy bruising" value="" options="no,yes" propid="91" itemid="170653" categoryid="138157" encounterid="[id number]"Easy bruising no. no" label="Swollen glands" value="" options="no,yes" propid="91" itemid="138158"  categoryid="138157" encounterid="[id number]"Swollen glands no.  DERMATOLOGY no" label="New/changing skin lesion" value="" options="no,yes" propid="91" itemid="199126" categoryid="12503" encounterid="[id number]"New/changing skin lesion no. no" label="Rash" value="" options="no,yes" propid="91" itemid="12504" categoryid="12503" encounterid="[id number]"Rash no. no" label="Sores" value="" options="" propid="91" itemid="444313" categoryid="12503" encounterid="[id number]"Sores no.  Negative except as stated in HPI.  Objective: Vitals: Wt 263.4, Wt change .4 lb, Ht 62.75, BMI 47.03, Pulse sitting 119, BP sitting 144/52.  Past Results: Examination:  General Examination alert, oriented, NAD " label="CONSTITUTIONAL:" categoryPropId="10089" examid="193638"CONSTITUTIONAL: alert, oriented, NAD .  moist, warm" label="SKIN:" categoryPropId="10109" examid="193638"SKIN: moist, warm.  Conjunctiva clear" label="EYES:" categoryPropId="21468" examid="193638"EYES: Conjunctiva clear.  clear to auscultation bilaterally no wheezes noted " label="LUNGS:" categoryPropId="87" examid="193638"LUNGS: clear to auscultation bilaterally no wheezes noted .  tachycardic regular rhythm " label="HEART:" categoryPropId="86" examid="193638"HEART: tachycardic regular rhythm .  soft, non-tender/non-distended, bowel sounds present " label="ABDOMEN:" categoryPropId="88" examid="193638"ABDOMEN: soft, non-tender/non-distended, bowel sounds present .  normal external genitalia, labia - unremarkable, vagina - pink moist mucosa, no lesions or abnormal discharge,.. small amount of blood in the vaginal vault .. cervix - no discharge or lesions or CMT, adnexa - no masses or tenderness, uterus - nontender and normal size on palpation " label="FEMALE GENITOURINARY:" categoryPropId="13414" examid="193638"FEMALE GENITOURINARY: normal external genitalia, labia - unremarkable, vagina - pink moist mucosa, no lesions or abnormal discharge,.. small amount of blood in  the vaginal vault .. cervix - no discharge or lesions or CMT, adnexa - no masses or tenderness, uterus - nontender and normal size on palpation .  affect normal, good eye contact" label="PSYCH:" categoryPropId="16316" examid="193638"PSYCH: affect normal, good eye contact.  Physical Examination: Pt aware of scribe services today.   Assessment: Assessment:  Abnormal uterine bleeding - N93.9 (Primary)     Acute blood loss anemia - D62     Plan: Treatment: Abnormal uterine bleeding Start Tranexamic Acid Tablet, 650 MG, as directed, Orally Notes: Admit to [facility] for transfusion and pelvic ultrasound ... endometrial biopsy attempted in the office howevere could not be completed due to cervical impedence... she may benefit from cytotec preprocedurally before next attempt... will start provera 10 mg for abnormal bleeding... Admission to hospital..  Time and coordination of care 50 minutes with over 50 % of that time spent face to face with the patient. Acute blood loss anemia Notes: Hgb 6.5 in the office today. Severe, will send pt to hospital for  observation and blood transfusion. Discussed risk of HIV, hepatitis B/C. Pt is agreeable to this. . Procedures:  Scribe Documentation " options="" propid="" itemid="[id number]" categoryid="[id number]" encounterid="[id number]"Attestation: I personally scribed for Dr. Richardson Doppole on the date of this appointment. Electronically signed by scribe , Grier RocherMalik, Omar 04/23/2020 12:59:41 PM > .  Immunizations: Therapeutic Injections: Diagnostic Imaging: Lab Reports: Procedure Orders: Preventive Medicine:    Health Risk Assessment: Care Plan:   Next Appointment:   Admit to hospital

## 2020-04-23 NOTE — H&P (Deleted)
  The note originally documented on this encounter has been moved the the encounter in which it belongs.  

## 2020-04-24 DIAGNOSIS — Z28310 Unvaccinated for covid-19: Secondary | ICD-10-CM | POA: Diagnosis not present

## 2020-04-24 DIAGNOSIS — D649 Anemia, unspecified: Secondary | ICD-10-CM | POA: Diagnosis not present

## 2020-04-24 DIAGNOSIS — J45909 Unspecified asthma, uncomplicated: Secondary | ICD-10-CM | POA: Diagnosis not present

## 2020-04-24 DIAGNOSIS — D62 Acute posthemorrhagic anemia: Secondary | ICD-10-CM | POA: Diagnosis not present

## 2020-04-24 DIAGNOSIS — N939 Abnormal uterine and vaginal bleeding, unspecified: Secondary | ICD-10-CM | POA: Diagnosis not present

## 2020-04-24 DIAGNOSIS — R7303 Prediabetes: Secondary | ICD-10-CM | POA: Diagnosis not present

## 2020-04-24 LAB — HEMOGLOBIN AND HEMATOCRIT, BLOOD
HCT: 24.9 % — ABNORMAL LOW (ref 36.0–46.0)
Hemoglobin: 7.5 g/dL — ABNORMAL LOW (ref 12.0–15.0)

## 2020-04-24 LAB — CBC WITH DIFFERENTIAL/PLATELET
Abs Immature Granulocytes: 0.09 10*3/uL — ABNORMAL HIGH (ref 0.00–0.07)
Basophils Absolute: 0.1 10*3/uL (ref 0.0–0.1)
Basophils Relative: 1 %
Eosinophils Absolute: 0.3 10*3/uL (ref 0.0–0.5)
Eosinophils Relative: 2 %
HCT: 29.1 % — ABNORMAL LOW (ref 36.0–46.0)
Hemoglobin: 9 g/dL — ABNORMAL LOW (ref 12.0–15.0)
Immature Granulocytes: 1 %
Lymphocytes Relative: 25 %
Lymphs Abs: 2.7 10*3/uL (ref 0.7–4.0)
MCH: 23.2 pg — ABNORMAL LOW (ref 26.0–34.0)
MCHC: 30.9 g/dL (ref 30.0–36.0)
MCV: 75 fL — ABNORMAL LOW (ref 80.0–100.0)
Monocytes Absolute: 0.9 10*3/uL (ref 0.1–1.0)
Monocytes Relative: 8 %
Neutro Abs: 7.1 10*3/uL (ref 1.7–7.7)
Neutrophils Relative %: 63 %
Platelets: 496 10*3/uL — ABNORMAL HIGH (ref 150–400)
RBC: 3.88 MIL/uL (ref 3.87–5.11)
RDW: 18.4 % — ABNORMAL HIGH (ref 11.5–15.5)
WBC: 11.1 10*3/uL — ABNORMAL HIGH (ref 4.0–10.5)
nRBC: 0.4 % — ABNORMAL HIGH (ref 0.0–0.2)

## 2020-04-24 LAB — CBC
HCT: 25.1 % — ABNORMAL LOW (ref 36.0–46.0)
Hemoglobin: 7.6 g/dL — ABNORMAL LOW (ref 12.0–15.0)
MCH: 22.6 pg — ABNORMAL LOW (ref 26.0–34.0)
MCHC: 30.3 g/dL (ref 30.0–36.0)
MCV: 74.5 fL — ABNORMAL LOW (ref 80.0–100.0)
Platelets: 498 10*3/uL — ABNORMAL HIGH (ref 150–400)
RBC: 3.37 MIL/uL — ABNORMAL LOW (ref 3.87–5.11)
RDW: 18.3 % — ABNORMAL HIGH (ref 11.5–15.5)
WBC: 10 10*3/uL (ref 4.0–10.5)
nRBC: 0.6 % — ABNORMAL HIGH (ref 0.0–0.2)

## 2020-04-24 LAB — PREPARE RBC (CROSSMATCH)

## 2020-04-24 MED ORDER — FERROUS SULFATE 325 (65 FE) MG PO TBEC: 325.0000 mg | DELAYED_RELEASE_TABLET | Freq: Two times a day (BID) | ORAL | 3 refills | Status: DC

## 2020-04-24 MED ORDER — DOCUSATE SODIUM 100 MG PO CAPS: 100.0000 mg | ORAL_CAPSULE | Freq: Two times a day (BID) | ORAL | 3 refills | Status: DC

## 2020-04-24 MED ORDER — SODIUM CHLORIDE 0.9% IV SOLUTION: Freq: Once | INTRAVENOUS | Status: AC

## 2020-04-24 MED ORDER — MISOPROSTOL 200 MCG PO TABS: 200.0000 ug | ORAL_TABLET | Freq: Once | ORAL | 0 refills | Status: DC

## 2020-04-24 NOTE — Progress Notes (Signed)
GYN Progress Note  Subjective:  Patient feeling well this morning.  SOB has improved, especially with ambulation. Bleeding also improved - did not have to change a pad overnight. Denies fevers, chills, chest pain, or SOB.  Objective: Blood pressure (!) 116/58, pulse 77, temperature 97.7 F (36.5 C), temperature source Oral, resp. rate 18, height 5\' 3"  (1.6 m), weight 119.3 kg, SpO2 99 %. Gen:  NAD, pleasant and cooperative Cardio:  RRR Lungs:  CTAB, no wheezes/rales/rhonchi Abd:  Soft, non-distended, non-tender throughout Ext:  Compression stockings in place, no bilateral LE edema  Results for orders placed or performed during the hospital encounter of 04/23/20  Hemoglobin and Hematocrit  Result Value Ref Range   Hemoglobin 6.3 (LL) 12.0 - 15.0 g/dL   HCT 06/23/20 (L) 19.3 - 79.0 %  CBC  Result Value Ref Range   WBC 10.0 4.0 - 10.5 K/uL   RBC 3.37 (L) 3.87 - 5.11 MIL/uL   Hemoglobin 7.6 (L) 12.0 - 15.0 g/dL   HCT 24.0 (L) 97.3 - 53.2 %   MCV 74.5 (L) 80.0 - 100.0 fL   MCH 22.6 (L) 26.0 - 34.0 pg   MCHC 30.3 30.0 - 36.0 g/dL   RDW 99.2 (H) 42.6 - 83.4 %   Platelets 498 (H) 150 - 400 K/uL   nRBC 0.6 (H) 0.0 - 0.2 %  Hemoglobin and hematocrit, blood  Result Value Ref Range   Hemoglobin 7.5 (L) 12.0 - 15.0 g/dL   HCT 19.6 (L) 22.2 - 97.9 %  Type and screen  Result Value Ref Range   ABO/RH(D) O POS    Antibody Screen NEG    Sample Expiration 04/26/2020,2359    Unit Number 06/26/2020    Blood Component Type RED CELLS,LR    Unit division 00    Status of Unit ISSUED    Transfusion Status OK TO TRANSFUSE    Crossmatch Result      Compatible Performed at [facility], [address], [place], [place]    Unit Number [id number]    Blood Component Type RED CELLS,LR    Unit division 00    Status of Unit ISSUED    Transfusion Status OK TO TRANSFUSE    Crossmatch Result Compatible   Prepare RBC (crossmatch)  Result Value Ref Range   Order Confirmation       ORDER PROCESSED BY BLOOD BANK Performed at [facility], [address], [place], [place]   ABO/Rh  Result Value Ref Range   ABO/RH(D)      O POS Performed at [facility], [address] [place], [place]   BPAM [facility]  Result Value Ref Range   ISSUE DATE / TIME [facility]    Blood Product Unit Number [id number]    PRODUCT CODE E0382V00    Unit Type and Rh 5100    Blood Product Expiration Date [id number]    ISSUE DATE / TIME [id number]    Blood Product Unit Number [id number]    PRODUCT CODE E0382V00    Unit Type and Rh 5100    Blood Product Expiration Date [id number]    TVUS: FINDINGS: Uterus  Measurements: 10.9 x 9.3 x 5.6 cm = volume: 295 mL. No fibroids or other mass visualized.  Endometrium  Thickness: 10 mm.  No focal abnormality visualized.  Right ovary  Measurements: 2.6 x 2.4 x 1.7 cm = volume: 6 mL. Normal appearance/no adnexal mass.  Left ovary  Not visualized.  Other findings:  No abnormal free fluid.  IMPRESSION: Left ovary is not visualized. Endometrial thickness is within normal limits for a premenopausal patient. If bleeding remains unresponsive to hormonal or medical therapy, sonohysterogram should be considered for focal lesion work-up. (Ref: Radiological Reasoning: Algorithmic Workup of Abnormal Vaginal Bleeding with Endovaginal Sonography and Sonohysterography. AJR 2008; 397:Q73-41)   A/P: 55 y.o. G2P2 admitted for symptomatic anemia secondary to AUB.  - S/p 2u pRBCs with mild improvement in symptoms - Hgb 7.6 (from 6.3 on admission) - Will transfuse one additional unit of packed red blood cells - Continue Lysteda for bleeding control - Reviewed TVUS as documented above - Discharge home after one more unit of packed red blood cells - Has outpatient follow-up in 1 week with repeat EMB attempt  Steva Ready, DO

## 2020-04-24 NOTE — Discharge Instructions (Signed)
Abnormal Uterine Bleeding Abnormal uterine bleeding means bleeding more than usual from your womb (uterus). It can include:  Bleeding between menstrual periods.  Bleeding after sex.  Bleeding that is heavier than normal.  Menstrual periods that last longer than usual.  Bleeding after you have stopped having your menstrual period (menopause). There are many problems that may cause this. You should see a doctor for any kind of bleeding that is not normal. Treatment depends on the cause of the bleeding. Follow these instructions at home: Medicines  Take over-the-counter and prescription medicines only as told by your doctor.  Tell your doctor about other medicines that you take. ? If told by your doctor, stop taking aspirin or medicines that have aspirin in them. These medicines can make you bleed more.  You may be given iron pills to replace iron that your body loses because of this condition. Take them as told by your doctor. Managing constipation If you are taking iron pills, you may have trouble pooping (constipation). To prevent or treat trouble pooping, you may need to:  Drink enough fluid to keep your pee (urine) pale yellow.  Take over-the-counter or prescription medicines.  Eat foods that are high in fiber. These include beans, whole grains, and fresh fruits and vegetables.  Limit foods that are high in fat and sugar. These include fried or sweet foods. General instructions  Watch your condition for any changes.  Do not use tampons, douche, or have sex, if your doctor tells you not to.  Change your pads often.  Get regular exams. This includes pelvic exams and cervical cancer screenings. ? It is up to you to get the results of any tests that are done. Ask your doctor, or the department that is doing the tests, when your results will be ready.  Keep all follow-up visits as told by your doctor. This is important. Contact a doctor if:  The bleeding lasts more than 1  week.  You feel dizzy at times.  You feel like you may vomit (nausea).  You vomit.  You feel light-headed or weak.  Your symptoms get worse. Get help right away if:  You pass out.  You have to change pads every hour.  You have pain in your belly.  You have a fever or chills.  You get sweaty.  You get weak.  You pass large blood clots from your vagina. Summary  Abnormal uterine bleeding means bleeding more than usual from your womb (uterus).  Any kind of bleeding that is not normal should be checked by a doctor.  Treatment depends on the cause of the bleeding.  Get help right away if you pass out, you have to change pads every hour, or you pass large blood clots from your vagina. This information is not intended to replace advice given to you by your health care provider. Make sure you discuss any questions you have with your health care provider. Document Revised: 11/09/2018 Document Reviewed: 11/09/2018 Elsevier Patient Education  2021 [employer]vier Inc.  

## 2020-04-24 NOTE — Discharge Summary (Signed)
Physician Discharge Summary  Patient ID: Tracey Figueroa MRN: [id number] DOB/AGE: Sep 11, 1965 55 y.o.  Admit date: 04/23/2020 Discharge date: 04/24/2020  Admission Diagnoses: Symptomatic anemia Abnormal uterine bleeding  Discharge Diagnoses:  Active Problems:   Symptomatic anemia Abnormal uterine bleeding  Discharged Condition: good  Hospital Course: Patient was admitted on 04/23/20 for symptomatic anemia (shortness of breath) secondary to abnormal uterine bleeding.  Her Hemoglobin was 6.3 on admission.  She received 3 units of packed red blood cells.  Hemoglobin prior to discharge was 9.0. She was started on Lysteda (Tranexamic acid PO) during her admission with notable improvement her bleeding. Her symptoms improved at time of discharge. She had a transvaginal US this admission (see below). She was discharged home in good condition with outpatient follow-up in 1 week.  Consults: None  Significant Diagnostic Studies:  CBC Latest Ref Rng & Units 04/24/2020 04/24/2020 04/24/2020  WBC 4.0 - 10.5 K/uL 11.1(H) 10.0 -  Hemoglobin 12.0 - 15.0 g/dL 9.0(L) 7.6(L) 7.5(L)  Hematocrit 36.0 - 46.0 % 29.1(L) 25.1(L) 24.9(L)  Platelets 150 - 400 K/uL 496(H) 498(H) -   Imaging: EXAM: TRANSABDOMINAL ULTRASOUND OF PELVIS  TECHNIQUE: Transabdominal ultrasound examination of the pelvis was performed including evaluation of the uterus, ovaries, adnexal regions, and pelvic cul-de-sac. Exam is somewhat limited due to body habitus.  COMPARISON:  Jun 19, 2009.  FINDINGS: Uterus  Measurements: 10.9 x 9.3 x 5.6 cm = volume: 295 mL. No fibroids or other mass visualized.  Endometrium  Thickness: 10 mm.  No focal abnormality visualized.  Right ovary  Measurements: 2.6 x 2.4 x 1.7 cm = volume: 6 mL. Normal appearance/no adnexal mass.  Left ovary  Not visualized.  Other findings:  No abnormal free fluid.  IMPRESSION: Left ovary is not visualized. Endometrial thickness is within  normal limits for a premenopausal patient. If bleeding remains unresponsive to hormonal or medical therapy, sonohysterogram should be considered for focal lesion work-up. (Ref: Radiological Reasoning: Algorithmic Workup of Abnormal Vaginal Bleeding with Endovaginal Sonography and Sonohysterography. AJR 2008; 865:H84-69)   Treatments: Blood transfusion - 3 units of packed red blood cells  Discharge Exam: Blood pressure (!) 125/44, pulse 75, temperature 98.2 F (36.8 C), temperature source Oral, resp. rate 16, height 5\' 3"  (1.6 m), weight 119.3 kg, SpO2 98 %. Gen:  NAD, pleasant and cooperative Cardio:  RRR Lungs:  CTAB, no wheezes/rales/rhonchi Abd:  Soft, non-distended, non-tender throughout Ext:  Compression stockings in place, no bilateral LE edema  Disposition: Discharge disposition: 01-Home or Self Care        Allergies as of 04/24/2020   No Known Allergies     Medication List    TAKE these medications   Advair Diskus 100-50 MCG/DOSE Aepb Generic drug: Fluticasone-Salmeterol 1 puff 2 (two) times daily.   docusate sodium 100 MG capsule Commonly known as: Colace Take 1 capsule (100 mg total) by mouth 2 (two) times daily.   ferrous sulfate 325 (65 FE) MG EC tablet Take 1 tablet (325 mg total) by mouth 2 (two) times daily with a meal.   hydrOXYzine 25 MG tablet Commonly known as: ATARAX/VISTARIL SMARTSIG:1 Tablet(s) By Mouth 1 to 3 Times Daily PRN   misoprostol 200 MCG tablet Commonly known as: Cytotec Take 1 tablet (200 mcg total) by mouth once for 1 dose. Place in vagina morning prior to procedure (endometrial biopsy).   Paxlovid 20 x 150 MG & 10 x 100MG  Tbpk Generic drug: Nirmatrelvir & Ritonavir TAKE 3 TABLETS BY MOUTH 2 TIMES DAILY FOR 5  DAYS   sertraline 100 MG tablet Commonly known as: ZOLOFT Take 100 mg by mouth daily.       Follow-up Information    Steva Ready, DO Follow up in 1 week(s).   Specialty: Obstetrics and Gynecology Why: Please  keep your visit scheduled for 4/12 at 1:30pm. Contact information: [address] [place] [place] [address] [phone]               Signed: Steva Ready 04/24/2020, 2:23 PM

## 2020-04-25 LAB — TYPE AND SCREEN
ABO/RH(D): O POS
Antibody Screen: NEGATIVE
Unit division: 0
Unit division: 0
Unit division: 0

## 2020-04-25 LAB — BPAM RBC
Blood Product Expiration Date: 202204072359
Blood Product Expiration Date: 202204272359
Blood Product Expiration Date: 202205052359
ISSUE DATE / TIME: 202204041707
ISSUE DATE / TIME: 202204041948
ISSUE DATE / TIME: 202204050833
Unit Type and Rh: 5100
Unit Type and Rh: 5100
Unit Type and Rh: 5100

## 2020-05-01 DIAGNOSIS — N939 Abnormal uterine and vaginal bleeding, unspecified: Secondary | ICD-10-CM | POA: Diagnosis not present

## 2020-05-01 DIAGNOSIS — Z3202 Encounter for pregnancy test, result negative: Secondary | ICD-10-CM | POA: Diagnosis not present

## 2020-05-18 ENCOUNTER — Encounter: Payer: Self-pay | Admitting: Allergy

## 2020-05-18 ENCOUNTER — Ambulatory Visit (INDEPENDENT_AMBULATORY_CARE_PROVIDER_SITE_OTHER): Payer: BC Managed Care – PPO | Admitting: Allergy

## 2020-05-18 ENCOUNTER — Other Ambulatory Visit: Payer: Self-pay

## 2020-05-18 VITALS — BP 140/64 | HR 90 | Temp 97.4°F | Resp 14 | Ht 62.5 in | Wt 267.4 lb

## 2020-05-18 DIAGNOSIS — J3089 Other allergic rhinitis: Secondary | ICD-10-CM | POA: Diagnosis not present

## 2020-05-18 DIAGNOSIS — J45909 Unspecified asthma, uncomplicated: Secondary | ICD-10-CM | POA: Diagnosis not present

## 2020-05-18 MED ORDER — FLUTICASONE-SALMETEROL 232-14 MCG/ACT IN AEPB: 1.0000 | INHALATION_SPRAY | Freq: Two times a day (BID) | RESPIRATORY_TRACT | 3 refills | Status: DC

## 2020-05-18 NOTE — Patient Instructions (Addendum)
Today's skin testing showed: Borderline positive to johnson grass only. Results given.   Some of your shortness of breathing and difficulty issues may be due to your anemia.   Asthma: . Daily controller medication(s): START Airduo 1 puff twice a day and rinse mouth after each use. Sample given, coupon given. Demonstrated proper use. o This replaces Advair Diskus for now.  o Let us know if not covered.  . May use albuterol rescue inhaler 2 puffs every 4 to 6 hours as needed for shortness of breath, chest tightness, coughing, and wheezing. May use albuterol rescue inhaler 2 puffs 5 to 15 minutes prior to strenuous physical activities. Monitor frequency of use.  . Asthma control goals:  o Full participation in all desired activities (may need albuterol before activity) o Albuterol use two times or less a week on average (not counting use with activity) o Cough interfering with sleep two times or less a month o Oral steroids no more than once a year o No hospitalizations  Environmental allergies  Start environmental control measures as below.  May use over the counter antihistamines such as Zyrtec (cetirizine), Claritin (loratadine), Allegra (fexofenadine), or Xyzal (levocetirizine) daily as needed.  Nasal saline spray (i.e., Simply Saline) is recommended as needed and prior to medicated nasal sprays.  Follow up in 3 months or sooner if needed.   Reducing Pollen Exposure . Pollen seasons: trees (spring), grass (summer) and ragweed/weeds (fall). Marland Kitchen Keep windows closed in your home and car to lower pollen exposure.  Lilian Kapur air conditioning in the bedroom and throughout the house if possible.  . Avoid going out in dry windy days - especially early morning. . Pollen counts are highest between 5 - 10 AM and on dry, hot and windy days.  . Save outside activities for late afternoon or after a heavy rain, when pollen levels are lower.  . Avoid mowing of grass if you have grass pollen  allergy. Marland Kitchen Be aware that pollen can also be transported indoors on people and pets.  . Dry your clothes in an automatic dryer rather than hanging them outside where they might collect pollen.  . Rinse hair and eyes before bedtime.

## 2020-05-18 NOTE — Assessment & Plan Note (Signed)
Rhinoconjunctivitis symptoms for the last 20+ years mainly in the spring and fall.  Takes Claritin as needed with good benefit.  No prior allergy/ENT evaluation.  Today's skin testing showed: Borderline positive to johnson grass only.  Start environmental control measures as below.  May use over the counter antihistamines such as Zyrtec (cetirizine), Claritin (loratadine), Allegra (fexofenadine), or Xyzal (levocetirizine) daily as needed.  Nasal saline spray (i.e., Simply Saline) is recommended as needed.

## 2020-05-18 NOTE — Assessment & Plan Note (Addendum)
Diagnosed with asthma over 5+ years ago.  Currently on Advair discus 100 mcg 1 puff twice a day and using albuterol less than once a week with good benefit.  Had COVID-19 in January 2022. Severe anemia requiring hospitalization.   Today's spirometry showed some restriction with 7% improvement in FEV1 post bronchodilator treatment.  Clinically feeling improved.  Discussed that some of her respiratory symptoms may be contributed by her anemia - managed by her ob/gyn.  . Daily controller medication(s): START Airduo 1 puff twice a day and rinse mouth after each use. Sample given, coupon given. Demonstrated proper use. o This replaces Advair Diskus for now.  o Let us know if not covered.  . May use albuterol rescue inhaler 2 puffs every 4 to 6 hours as needed for shortness of breath, chest tightness, coughing, and wheezing. May use albuterol rescue inhaler 2 puffs 5 to 15 minutes prior to strenuous physical activities. Monitor frequency of use.  . Repeat spirometry at next visit.

## 2020-05-18 NOTE — Progress Notes (Signed)
New Patient Note  RE: Tracey Figueroa MRN: [id number] DOB: Mar 31, 1965 Date of Office Visit: 05/18/2020  Consult requested by: Maurice Small, MD Primary care provider: Maurice Small, MD  Chief Complaint: Asthma (Says her PCP stated that it is getting worse. Patient states that she is not using her rescue inhaler often as she use to. ), Allergic Rhinitis  (Patient states that she isn't aware of any environmental allergies. PCP referred her stating that things wee getting worse and never expressed what was worse. Patient believes that her health is pretty good.), and Allergy Testing (Patient believes she has a gluten allergy and wants to confirm it.)  History of Present Illness: I had the pleasure of seeing Tracey Figueroa for initial evaluation at the [facility] on 05/18/2020. She is a 55 y.o. female, who is referred here by Maurice Small, MD for the evaluation of asthma and allergic rhinitis.  Asthma:  Act score 21. She reports symptoms of chest tightness, shortness of breath, coughing with post tussive emesis, wheezing, nocturnal awakenings for 5+ years. Current medications include Advair Diskus 1 puff BID and albuterol prn which help. She tried the following inhalers: none. Main triggers are exercise, strong scents/fumes. In the last month, frequency of symptoms: twice a day but not sure if it was from asthma or anemia. Frequency of nocturnal symptoms: 0x/month. Frequency of SABA use: <1x/week. Interference with physical activity: yes. In the last 12 months, emergency room visits/urgent care visits/doctor office visits or hospitalizations due to respiratory issues: once for severe anemia. In the last 12 months, oral steroids courses: one. Lifetime history of hospitalization for respiratory issues: no. Prior intubations: no. History of pneumonia: no. She was evaluated by pulmonologist in the past. Smoking exposure: no. Up to date with flu vaccine: yes. Up to date  with COVID-19 vaccine: yes. Prior Covid-19 infection: January 2022. History of reflux: not anymore.  Rhinitis:  She reports symptoms of sneezing, watery eyes. Symptoms have been going on for 20+ years. The symptoms are present mainly in the spring and fall. She has used Claritin prn with some improvement in symptoms. Sinus infections: no. Previous work up includes: none. Previous ENT evaluation: no. Previous sinus imaging: no. History of nasal polyps: no. Last eye exam: last year.  Assessment and Plan: Tracey Figueroa is a 55 y.o. female with: Asthma Diagnosed with asthma over 5+ years ago.  Currently on Advair discus 100 mcg 1 puff twice a day and using albuterol less than once a week with good benefit.  Had COVID-19 in January 2022. Severe anemia requiring hospitalization.   Today's spirometry showed some restriction with 7% improvement in FEV1 post bronchodilator treatment.  Clinically feeling improved.  Discussed that some of her respiratory symptoms may be contributed by her anemia - managed by her ob/gyn.  . Daily controller medication(s): START Airduo 1 puff twice a day and rinse mouth after each use. Sample given, coupon given. Demonstrated proper use. o This replaces Advair Diskus for now.  o Let us know if not covered.  . May use albuterol rescue inhaler 2 puffs every 4 to 6 hours as needed for shortness of breath, chest tightness, coughing, and wheezing. May use albuterol rescue inhaler 2 puffs 5 to 15 minutes prior to strenuous physical activities. Monitor frequency of use.  . Repeat spirometry at next visit.  Other allergic rhinitis Rhinoconjunctivitis symptoms for the last 20+ years mainly in the spring and fall.  Takes Claritin as needed with good benefit.  No prior allergy/ENT evaluation.  Today's skin testing showed: Borderline positive to johnson grass only.  Start environmental control measures as below.  May use over the counter antihistamines such as Zyrtec  (cetirizine), Claritin (loratadine), Allegra (fexofenadine), or Xyzal (levocetirizine) daily as needed.  Nasal saline spray (i.e., Simply Saline) is recommended as needed.  Return in about 3 months (around 08/17/2020).  Meds ordered this encounter  Medications  . Fluticasone-Salmeterol (AIRDUO RESPICLICK 232/14) 232-14 MCG/ACT AEPB    Sig: Inhale 1 puff into the lungs in the morning and at bedtime. Rinse mouth after each use.    Dispense:  1 each    Refill:  3    Patient bringing coupon.   Lab Orders  No laboratory test(s) ordered today    Other allergy screening: Food allergy: no  Currently avoiding gluten to decrease inflammation for joint pains. Medication allergy: no Hymenoptera allergy: no Urticaria: no Eczema:no History of recurrent infections suggestive of immunodeficency: no  Diagnostics: Spirometry:  Tracings reviewed. Her effort: Good reproducible efforts. FVC: 2.48L FEV1: 1.97L, 71% predicted FEV1/FVC ratio: 79% Interpretation: Spirometry consistent with possible restrictive disease with 7% improvement in FEV1 post bronchodilator treatment. Clinically feeling improved.   Please see scanned spirometry results for details.  Skin Testing: Environmental allergy panel. Borderline positive to johnson grass only. Results discussed with patient/family.  Airborne Adult Perc - 05/18/20 0928    Time Antigen Placed 1610    Allergen Manufacturer Waynette Buttery    Location Back    Number of Test 59    1. Control-Buffer 50% Glycerol Negative    2. Control-Histamine 1 mg/ml 2+    3. Albumin saline Negative    4. Bahia Negative    5. French Southern Territories Negative    6. Johnson Negative    7. Kentucky Blue Negative    8. Meadow Fescue Negative    9. Perennial Rye Negative    10. Sweet Vernal Negative    11. Timothy Negative    12. Cocklebur Negative    13. Burweed Marshelder Negative    14. Ragweed, short Negative    15. Ragweed, Giant Negative    16. Plantain,  English Negative    17.  Lamb's Quarters Negative    18. Sheep Sorrell Negative    19. Rough Pigweed Negative    20. Marsh Elder, Rough Negative    21. Mugwort, Common Negative    22. Ash mix Negative    23. Birch mix Negative    24. Beech American Negative    25. Box, Elder Negative    26. Cedar, red Negative    27. Cottonwood, Guinea-Bissau Negative    28. Elm mix Negative    29. Hickory Negative    30. Maple mix Negative    31. Oak, Guinea-Bissau mix Negative    32. Pecan Pollen Negative    33. Pine mix Negative    34. Sycamore Eastern Negative    35. Walnut, Black Pollen Negative    36. Alternaria alternata Negative    37. Cladosporium Herbarum Negative    38. Aspergillus mix Negative    39. Penicillium mix Negative    40. Bipolaris sorokiniana (Helminthosporium) Negative    41. Drechslera spicifera (Curvularia) Negative    42. Mucor plumbeus Negative    43. Fusarium moniliforme Negative    44. Aureobasidium pullulans (pullulara) Negative    45. Rhizopus oryzae Negative    46. Botrytis cinera Negative    47. Epicoccum nigrum Negative    48. Phoma betae Negative  49. Candida Albicans Negative    50. Trichophyton mentagrophytes Negative    51. Mite, D Farinae  5,000 AU/ml Negative    52. Mite, D Pteronyssinus  5,000 AU/ml Negative    53. Cat Hair 10,000 BAU/ml Negative    54.  Dog Epithelia Negative    55. Mixed Feathers Negative    56. Horse Epithelia Negative    57. Cockroach, German Negative    58. Mouse Negative    59. Tobacco Leaf Negative          Intradermal - 05/18/20 1015    Time Antigen Placed 1016    Allergen Manufacturer Waynette ButteryGreer    Location Arm    Number of Test 15    Control Negative    French Southern TerritoriesBermuda Negative    Johnson --   +/-   7 Grass Negative    Ragweed mix Negative    Weed mix Negative    Tree mix Negative    Mold 1 Negative    Mold 2 Negative    Mold 3 Negative    Mold 4 Negative    Cat Negative    Dog Negative    Cockroach Negative    Mite mix Negative            Past Medical History: Patient Active Problem List   Diagnosis Date Noted  . Other allergic rhinitis 05/18/2020  . Symptomatic anemia 04/23/2020  . Diabetes mellitus type 2 in obese (HCC)   . Obesity   . Asthma   . COVID-19 02/19/2020   Past Medical History:  Diagnosis Date  . Asthma   . COVID-19 02/19/2020   Rx'd with Paxlovid  . Diabetes mellitus type 2 in obese (HCC)   . Obesity    Past Surgical History: Past Surgical History:  Procedure Laterality Date  . CESAREAN SECTION  03/1994  . CESAREAN SECTION  06/1998   Medication List:  Current Outpatient Medications  Medication Sig Dispense Refill  . diclofenac Sodium (VOLTAREN) 1 % GEL See admin instructions.    . docusate sodium (COLACE) 100 MG capsule Take 1 capsule (100 mg total) by mouth 2 (two) times daily. 60 capsule 3  . ferrous sulfate 325 (65 FE) MG EC tablet Take 1 tablet (325 mg total) by mouth 2 (two) times daily with a meal. 60 tablet 3  . Fluticasone-Salmeterol (AIRDUO RESPICLICK 232/14) 232-14 MCG/ACT AEPB Inhale 1 puff into the lungs in the morning and at bedtime. Rinse mouth after each use. 1 each 3  . Nutritional Supplements (JUICE PLUS FIBRE) LIQD See admin instructions.    . predniSONE (DELTASONE) 20 MG tablet Take 40 mg by mouth daily.    . sertraline (ZOLOFT) 100 MG tablet Take 100 mg by mouth daily.    . tranexamic acid (LYSTEDA) 650 MG TABS tablet Take 1,300 mg by mouth 3 (three) times daily. As neded    . UNABLE TO FIND DoTerra    . misoprostol (CYTOTEC) 200 MCG tablet Take 1 tablet (200 mcg total) by mouth once for 1 dose. Place in vagina morning prior to procedure (endometrial biopsy). 1 tablet 0  . Nirmatrelvir & Ritonavir 20 x 150 MG & 10 x 100MG  TBPK TAKE 3 TABLETS BY MOUTH 2 TIMES DAILY FOR 5 DAYS (Patient not taking: Reported on 05/18/2020) 30 each 0   No current facility-administered medications for this visit.   Allergies: Allergies  Allergen Reactions  . Erythromycin     Other  reaction(s): stomach upset  . Prednisone Other (See  Comments)   Social History: Social History   Socioeconomic History  . Marital status: Married    Spouse name: Not on file  . Number of children: Not on file  . Years of education: Not on file  . Highest education level: Not on file  Occupational History  . Not on file  Tobacco Use  . Smoking status: Never Smoker  . Smokeless tobacco: Never Used  Vaping Use  . Vaping Use: Never used  Substance and Sexual Activity  . Alcohol use: Yes    Comment: social  . Drug use: Not Currently  . Sexual activity: Not Currently  Other Topics Concern  . Not on file  Social History Narrative  . Not on file   Social Determinants of Health   Financial Resource Strain: Not on file  Food Insecurity: Not on file  Transportation Needs: Not on file  Physical Activity: Not on file  Stress: Not on file  Social Connections: Not on file   Lives in a house which is about 54 years old. Smoking: denies Occupation: Administrator, arts HistorySurveyor, minerals in the house: yes Carpet in the family room: no Carpet in the bedroom: no Heating: gas Cooling: central Pet: no  Family History: History reviewed. No pertinent family history. Problem                               Relation Asthma                                   No  Eczema                                No Food allergy                          No  Allergic rhino conjunctivitis     Mother, brother   Review of Systems  Constitutional: Negative for appetite change, chills, fever and unexpected weight change.  HENT: Positive for sneezing. Negative for congestion and rhinorrhea.   Eyes: Negative for itching.  Respiratory: Positive for cough, chest tightness, shortness of breath and wheezing.   Cardiovascular: Negative for chest pain.  Gastrointestinal: Negative for abdominal pain.  Genitourinary: Negative for difficulty urinating.  Skin: Negative for rash.    Objective: BP 140/64   Pulse 90   Temp (!) 97.4 F (36.3 C)   Resp 14   Ht 5' 2.5" (1.588 m)   Wt 267 lb 6.4 oz (121.3 kg)   SpO2 97%   BMI 48.13 kg/m  Body mass index is 48.13 kg/m. Physical Exam Vitals and nursing note reviewed.  Constitutional:      Appearance: Normal appearance. She is well-developed.  HENT:     Head: Normocephalic and atraumatic.     Right Ear: Tympanic membrane and external ear normal.     Left Ear: Tympanic membrane and external ear normal.     Nose: Nose normal.     Mouth/Throat:     Mouth: Mucous membranes are moist.     Pharynx: Oropharynx is clear.  Eyes:     Conjunctiva/sclera: Conjunctivae normal.  Cardiovascular:     Rate and Rhythm: Normal rate and regular rhythm.     Heart sounds: Normal heart sounds. No murmur  heard. No friction rub. No gallop.   Pulmonary:     Effort: Pulmonary effort is normal.     Breath sounds: Normal breath sounds. No wheezing, rhonchi or rales.  Musculoskeletal:     Cervical back: Neck supple.  Skin:    General: Skin is warm.     Findings: No rash.  Neurological:     Mental Status: She is alert and oriented to person, place, and time.  Psychiatric:        Behavior: Behavior normal.    The plan was reviewed with the patient/family, and all questions/concerned were addressed.  It was my pleasure to see Tracey Figueroa today and participate in her care. Please feel free to contact me with any questions or concerns.  Sincerely,  Wyline Mood, DO Allergy & Immunology  [facility] office: [phone] [facility] office: [phone]

## 2020-05-24 DIAGNOSIS — N939 Abnormal uterine and vaginal bleeding, unspecified: Secondary | ICD-10-CM | POA: Diagnosis not present

## 2020-05-24 DIAGNOSIS — Z01818 Encounter for other preprocedural examination: Secondary | ICD-10-CM | POA: Diagnosis not present

## 2020-05-24 NOTE — H&P (Signed)
Tracey Figueroa is an 55 y.o. G2P2 who is admitted for Hysteroscopy with Dilation and Curettage and possible Myosure polypectomy for abnormal uterine bleeding.  Patient began having heavy periods starting July/August 2021. Prior to this, her periods were normal and she was on birth control prior to this a few years ago. On 04/20/20, patient reported very heavy bleeding and needing to change an ultra tampon every 3-4 hours. CBC was drawn on 4/4 and Hgb was 6.5. She was direct admitted from the office for a blood transfusion. Unfortunately, outpatient attempt at EMB during this visit was unsuccessful.  During her hospital admission from 4/4-4/5, she received 3 units of packed red blood cells and started on Lysteda. Hgb rose appropriately to 9.0. Her bleeding subsided and she was discharged home in stable condition.  She returned to the office on 4/12 but EMB was unsuccessful again, suspected due to the angle of the cervix. We reviewed risks/benefits of outpatient procedure for adequate endometrial sampling in order to determine future management.  TVUS (04/23/20): Uterus 10.9 x 9.3 x 5.6cm, no fibroids or other mass visualized, endometrium 76mm, no focal abnormality visualized. R ovary 2.6cm. No abnormal free fluid. Left ovary is not visualized. Endometrial thickness is within normallimits for a premenopausal patient. If bleeding remains unresponsive to hormonal or medical therapy, sonohysterogram should be considered for focal lesion work-up. Pap smear (2019): NILM, endometrial cells present TSH (2021): Normal CBC (05/01/20): H/H 10.3/33  Patient Active Problem List   Diagnosis Date Noted  . Other allergic rhinitis 05/18/2020  . Symptomatic anemia 04/23/2020  . Diabetes mellitus type 2 in obese (HCC)   . Obesity   . Asthma   . COVID-19 02/19/2020   OB History: G1: cesarean section G2: cesarean section  MEDICAL/FAMILY/SOCIAL HX: No LMP recorded.    Past Medical History:  Diagnosis Date   . Asthma   . COVID-19 02/19/2020   Rx'd with Paxlovid  . Diabetes mellitus type 2 in obese (HCC)   . Obesity     Past Surgical History:  Procedure Laterality Date  . CESAREAN SECTION  03/1994  . CESAREAN SECTION  06/1998    No family history on file.  Social History:  reports that she has never smoked. She has never used smokeless tobacco. She reports current alcohol use. She reports previous drug use.  ALLERGIES/MEDS:  Allergies:  Allergies  Allergen Reactions  . Erythromycin     Other reaction(s): stomach upset  . Prednisone Other (See Comments)    No medications prior to admission.     Review of Systems  Constitutional: Negative.   HENT: Negative.   Eyes: Negative.   Respiratory: Negative.   Cardiovascular: Negative.   Gastrointestinal: Negative.   Genitourinary: Negative.   Musculoskeletal: Negative.   Skin: Negative.   Neurological: Negative.   Endo/Heme/Allergies: Negative.   Psychiatric/Behavioral: Negative.     There were no vitals taken for this visit. Gen:  NAD, pleasant and cooperative Cardio:  RRR Lungs: CTAB, no wheezes/rales/rhonchi Abd: Soft, non-distended, non-tender Ext:  No bilateral LE edema Pelvic: Normal external genitalia, vagina pink/moist mucosa without lesions, cervix palpably normal - unable to visualize on exam on 05/01/20  No results found for this or any previous visit (from the past 24 hour(s)).  No results found.   ASSESSMENT/PLAN: [name] is a 55 y.o. Hysteroscopy with Dilation and Curettage and possible Myosure polypectomy for abnormal uterine bleeding.  - Admit to [facility] - Admit labs/imaging: CBC, BMP, CXR, EKG, COVID-19 screen - Diet:  NPO - IVF:  Per anesthesia - VTE Prophylaxis:  SCDs - Antibiotics: None - Anticipate D/C home same day  Consents: I discussed with the patient that this surgery is performed to look inside the uterus and remove the uterine lining.  Prior to surgery, the risks and benefits  of the surgery, as well as alternative treatments, have been discussed.  The risks include, but are not limited to bleeding, including the need for a blood transfusion, infection, damage to organs and tissues, including uterine perforation, requiring additional surgery, postoperative pain, short-term and long-term, failure of the procedure to control symptoms, need for hysterectomy to control bleeding, fluid overload, which could create electrolyte abnormalities and the need to stop the procedure before completion, inability to safely complete the procedure, deep vein thrombosis and/or pulmonary embolism, painful intercourse, complications the course of which cannot be predicted or prevented, and death.  Patient was consented for blood products.  The patient is aware that bleeding may result in the need for a blood transfusion which includes risk of transmission of HIV (1:2 million), Hepatitis C (1:2 million), and Hepatitis B (1:200 thousand) and transfusion reaction.  Patient voiced understanding of the above risks as well as understanding of indications for blood transfusion.   Steva Ready, DO [phone] (office)

## 2020-05-29 ENCOUNTER — Encounter (HOSPITAL_BASED_OUTPATIENT_CLINIC_OR_DEPARTMENT_OTHER): Payer: Self-pay | Admitting: Obstetrics and Gynecology

## 2020-06-05 ENCOUNTER — Encounter (HOSPITAL_BASED_OUTPATIENT_CLINIC_OR_DEPARTMENT_OTHER): Payer: Self-pay | Admitting: Obstetrics and Gynecology

## 2020-06-05 ENCOUNTER — Other Ambulatory Visit: Payer: Self-pay

## 2020-06-05 ENCOUNTER — Other Ambulatory Visit (HOSPITAL_COMMUNITY)
Admission: RE | Admit: 2020-06-05 | Discharge: 2020-06-05 | Disposition: A | Discharge status: Home / Self Care | Payer: BC Managed Care – PPO | Source: Ambulatory Visit | Attending: Obstetrics and Gynecology | Admitting: Obstetrics and Gynecology

## 2020-06-05 DIAGNOSIS — Z888 Allergy status to other drugs, medicaments and biological substances status: Secondary | ICD-10-CM | POA: Diagnosis not present

## 2020-06-05 DIAGNOSIS — Z01812 Encounter for preprocedural laboratory examination: Secondary | ICD-10-CM | POA: Insufficient documentation

## 2020-06-05 DIAGNOSIS — Z20822 Contact with and (suspected) exposure to covid-19: Secondary | ICD-10-CM | POA: Insufficient documentation

## 2020-06-05 DIAGNOSIS — N939 Abnormal uterine and vaginal bleeding, unspecified: Secondary | ICD-10-CM | POA: Diagnosis not present

## 2020-06-05 DIAGNOSIS — Z8616 Personal history of COVID-19: Secondary | ICD-10-CM | POA: Diagnosis not present

## 2020-06-05 DIAGNOSIS — N840 Polyp of corpus uteri: Secondary | ICD-10-CM | POA: Diagnosis not present

## 2020-06-06 ENCOUNTER — Encounter (HOSPITAL_BASED_OUTPATIENT_CLINIC_OR_DEPARTMENT_OTHER)
Admission: RE | Admit: 2020-06-06 | Discharge: 2020-06-06 | Disposition: A | Discharge status: Home / Self Care | Payer: BC Managed Care – PPO | Source: Ambulatory Visit | Attending: Obstetrics and Gynecology | Admitting: Obstetrics and Gynecology

## 2020-06-06 DIAGNOSIS — N840 Polyp of corpus uteri: Secondary | ICD-10-CM | POA: Diagnosis not present

## 2020-06-06 DIAGNOSIS — Z20822 Contact with and (suspected) exposure to covid-19: Secondary | ICD-10-CM | POA: Diagnosis not present

## 2020-06-06 DIAGNOSIS — N939 Abnormal uterine and vaginal bleeding, unspecified: Secondary | ICD-10-CM | POA: Diagnosis not present

## 2020-06-06 DIAGNOSIS — Z888 Allergy status to other drugs, medicaments and biological substances status: Secondary | ICD-10-CM | POA: Diagnosis not present

## 2020-06-06 DIAGNOSIS — Z8616 Personal history of COVID-19: Secondary | ICD-10-CM | POA: Diagnosis not present

## 2020-06-06 LAB — CBC
HCT: 42.1 % (ref 36.0–46.0)
Hemoglobin: 13 g/dL (ref 12.0–15.0)
MCH: 24.5 pg — ABNORMAL LOW (ref 26.0–34.0)
MCHC: 30.9 g/dL (ref 30.0–36.0)
MCV: 79.4 fL — ABNORMAL LOW (ref 80.0–100.0)
Platelets: 418 10*3/uL — ABNORMAL HIGH (ref 150–400)
RBC: 5.3 MIL/uL — ABNORMAL HIGH (ref 3.87–5.11)
RDW: 21.2 % — ABNORMAL HIGH (ref 11.5–15.5)
WBC: 11.4 10*3/uL — ABNORMAL HIGH (ref 4.0–10.5)
nRBC: 0 % (ref 0.0–0.2)

## 2020-06-06 LAB — BASIC METABOLIC PANEL
Anion gap: 9 (ref 5–15)
BUN: 11 mg/dL (ref 6–20)
CO2: 25 mmol/L (ref 22–32)
Calcium: 9.6 mg/dL (ref 8.9–10.3)
Chloride: 103 mmol/L (ref 98–111)
Creatinine, Ser: 0.7 mg/dL (ref 0.44–1.00)
GFR, Estimated: >60 mL/min (ref >60)
Glucose, Bld: 105 mg/dL — ABNORMAL HIGH (ref 70–99)
Potassium: 3.9 mmol/L (ref 3.5–5.1)
Sodium: 137 mmol/L (ref 135–145)

## 2020-06-06 LAB — TYPE AND SCREEN
ABO/RH(D): O POS
Antibody Screen: NEGATIVE

## 2020-06-06 LAB — POCT PREGNANCY, URINE: Preg Test, Ur: NEGATIVE

## 2020-06-06 LAB — SARS CORONAVIRUS 2 (TAT 6-24 HRS): SARS Coronavirus 2: NEGATIVE

## 2020-06-06 NOTE — Progress Notes (Signed)
Anesthesia consult complete. Pt ok for surgery at [facility].

## 2020-06-06 NOTE — Anesthesia Preprocedure Evaluation (Addendum)
Anesthesia Evaluation  Patient identified by MRN, date of birth, ID band Patient awake    Reviewed: Allergy & Precautions, Patient's Chart, lab work & pertinent test results  Airway Mallampati: III  TM Distance: >3 FB Neck ROM: Full    Dental  (+) Teeth Intact, Dental Advisory Given   Pulmonary asthma ,  COVID 01/2020 no symptoms at the time   Snores at night, no witnessed apneas, has never had sleep study   Recently added new inhaler (airduo) a couple months ago and has done well since then; uses rescue inhaler once per month    Pulmonary exam normal breath sounds clear to auscultation       Cardiovascular negative cardio ROS Normal cardiovascular exam Rhythm:Regular Rate:Normal     Neuro/Psych PSYCHIATRIC DISORDERS Anxiety Depression negative neurological ROS     GI/Hepatic negative GI ROS, Neg liver ROS,   Endo/Other  diabetesMorbid obesityPre-diabetic, a1c 6.8 BMI 48  Renal/GU negative Renal ROS  Female GU complaint (AUB)     Musculoskeletal  (+) Arthritis , Osteoarthritis,  Back surgery 2020 at outpatient center- no issues   Abdominal (+) + obese,   Peds  Hematology  (+) Blood dyscrasia, anemia , Last hb 9 Was hospitalized a few months ago for ABLA (Hb 6) 2/2 abnormal uterine bleeding, received 3 units of prbcs per pt and has been well since then   Anesthesia Other Findings   Reproductive/Obstetrics negative OB ROS                          Anesthesia Physical Anesthesia Plan  ASA: III  Anesthesia Plan: General   Post-op Pain Management:    Induction: Intravenous  PONV Risk Score and Plan: 3 and Ondansetron, Dexamethasone and Midazolam  Airway Management Planned: LMA  Additional Equipment:   Intra-op Plan:   Post-operative Plan: Extubation in OR  Informed Consent: I have reviewed the patients History and Physical, chart, labs and discussed the procedure including the  risks, benefits and alternatives for the proposed anesthesia with the patient or authorized representative who has indicated his/her understanding and acceptance.       Plan Discussed with: CRNA and Anesthesiologist  Anesthesia Plan Comments: (preop clearance by Finucane- reassuring airway, asthma well controlled. D/w patient that she should use both inhalers the morning of surgery. D/w pt possibility of blood transfusion. Type and screen sent at PAT visit today.)       Anesthesia Quick Evaluation

## 2020-06-07 LAB — RPR: RPR Ser Ql: NONREACTIVE

## 2020-06-08 ENCOUNTER — Encounter (HOSPITAL_BASED_OUTPATIENT_CLINIC_OR_DEPARTMENT_OTHER): Payer: Self-pay | Admitting: Obstetrics and Gynecology

## 2020-06-08 ENCOUNTER — Other Ambulatory Visit: Payer: Self-pay

## 2020-06-08 ENCOUNTER — Ambulatory Visit (HOSPITAL_BASED_OUTPATIENT_CLINIC_OR_DEPARTMENT_OTHER): Payer: BC Managed Care – PPO | Admitting: Anesthesiology

## 2020-06-08 ENCOUNTER — Ambulatory Visit (HOSPITAL_BASED_OUTPATIENT_CLINIC_OR_DEPARTMENT_OTHER)
Admission: RE | Admit: 2020-06-08 | Discharge: 2020-06-08 | Disposition: A | Discharge status: Home / Self Care | Payer: BC Managed Care – PPO | Source: Ambulatory Visit | Attending: Obstetrics and Gynecology | Admitting: Obstetrics and Gynecology

## 2020-06-08 ENCOUNTER — Encounter (HOSPITAL_BASED_OUTPATIENT_CLINIC_OR_DEPARTMENT_OTHER)
Admission: RE | Disposition: A | Discharge status: Home / Self Care | Payer: Self-pay | Source: Ambulatory Visit | Attending: Obstetrics and Gynecology

## 2020-06-08 DIAGNOSIS — N939 Abnormal uterine and vaginal bleeding, unspecified: Secondary | ICD-10-CM | POA: Insufficient documentation

## 2020-06-08 DIAGNOSIS — Z888 Allergy status to other drugs, medicaments and biological substances status: Secondary | ICD-10-CM | POA: Insufficient documentation

## 2020-06-08 DIAGNOSIS — N840 Polyp of corpus uteri: Secondary | ICD-10-CM | POA: Insufficient documentation

## 2020-06-08 DIAGNOSIS — Z8616 Personal history of COVID-19: Secondary | ICD-10-CM | POA: Insufficient documentation

## 2020-06-08 DIAGNOSIS — E119 Type 2 diabetes mellitus without complications: Secondary | ICD-10-CM | POA: Diagnosis not present

## 2020-06-08 DIAGNOSIS — F418 Other specified anxiety disorders: Secondary | ICD-10-CM | POA: Diagnosis not present

## 2020-06-08 DIAGNOSIS — Z20822 Contact with and (suspected) exposure to covid-19: Secondary | ICD-10-CM | POA: Diagnosis not present

## 2020-06-08 DIAGNOSIS — D649 Anemia, unspecified: Secondary | ICD-10-CM | POA: Diagnosis not present

## 2020-06-08 HISTORY — DX: Anxiety disorder, unspecified: F41.9

## 2020-06-08 HISTORY — DX: Prediabetes: R73.03

## 2020-06-08 HISTORY — DX: Depression, unspecified: F32.A

## 2020-06-08 HISTORY — DX: Abnormal uterine and vaginal bleeding, unspecified: N93.9

## 2020-06-08 HISTORY — PX: DILATATION & CURETTAGE/HYSTEROSCOPY WITH MYOSURE: SHX6511

## 2020-06-08 HISTORY — DX: Unspecified osteoarthritis, unspecified site: M19.90

## 2020-06-08 SURGERY — DILATATION & CURETTAGE/HYSTEROSCOPY WITH MYOSURE
Anesthesia: General | Site: Uterus

## 2020-06-08 MED ORDER — MIDAZOLAM HCL 2 MG/2ML IJ SOLN
INTRAMUSCULAR | Status: AC
  Filled 2020-06-08: qty 2

## 2020-06-08 MED ORDER — FENTANYL CITRATE (PF) 100 MCG/2ML IJ SOLN
INTRAMUSCULAR | Status: AC
  Filled 2020-06-08: qty 2

## 2020-06-08 MED ORDER — PHENYLEPHRINE HCL (PRESSORS) 10 MG/ML IV SOLN
INTRAVENOUS | Status: DC | PRN
  Administered 2020-06-08 (×2): 80 ug via INTRAVENOUS

## 2020-06-08 MED ORDER — SILVER NITRATE-POT NITRATE 75-25 % EX MISC
CUTANEOUS | Status: AC
  Filled 2020-06-08: qty 10

## 2020-06-08 MED ORDER — SODIUM CHLORIDE 0.9 % IR SOLN
Status: DC | PRN
  Administered 2020-06-08: 6300 mL

## 2020-06-08 MED ORDER — ONDANSETRON HCL 4 MG/2ML IJ SOLN
INTRAMUSCULAR | Status: AC
  Filled 2020-06-08: qty 2

## 2020-06-08 MED ORDER — BUPIVACAINE HCL (PF) 0.25 % IJ SOLN
INTRAMUSCULAR | Status: AC
  Filled 2020-06-08: qty 90

## 2020-06-08 MED ORDER — DEXAMETHASONE SODIUM PHOSPHATE 10 MG/ML IJ SOLN
INTRAMUSCULAR | Status: AC
  Filled 2020-06-08: qty 1

## 2020-06-08 MED ORDER — EPHEDRINE SULFATE 50 MG/ML IJ SOLN
INTRAMUSCULAR | Status: DC | PRN
  Administered 2020-06-08 (×2): 10 mg via INTRAVENOUS

## 2020-06-08 MED ORDER — DEXAMETHASONE SODIUM PHOSPHATE 4 MG/ML IJ SOLN
INTRAMUSCULAR | Status: DC | PRN
  Administered 2020-06-08: 10 mg via INTRAVENOUS

## 2020-06-08 MED ORDER — FENTANYL CITRATE (PF) 100 MCG/2ML IJ SOLN: 25.0000 ug | INTRAMUSCULAR | Status: DC | PRN

## 2020-06-08 MED ORDER — LIDOCAINE 2% (20 MG/ML) 5 ML SYRINGE
INTRAMUSCULAR | Status: AC
  Filled 2020-06-08: qty 5

## 2020-06-08 MED ORDER — FENTANYL CITRATE (PF) 100 MCG/2ML IJ SOLN
INTRAMUSCULAR | Status: DC | PRN
  Administered 2020-06-08: 100 ug via INTRAVENOUS
  Administered 2020-06-08 (×2): 25 ug via INTRAVENOUS

## 2020-06-08 MED ORDER — SILVER NITRATE-POT NITRATE 75-25 % EX MISC
CUTANEOUS | Status: DC | PRN
  Administered 2020-06-08: 4

## 2020-06-08 MED ORDER — MIDAZOLAM HCL 5 MG/5ML IJ SOLN
INTRAMUSCULAR | Status: DC | PRN
  Administered 2020-06-08: 2 mg via INTRAVENOUS

## 2020-06-08 MED ORDER — PHENYLEPHRINE 40 MCG/ML (10ML) SYRINGE FOR IV PUSH (FOR BLOOD PRESSURE SUPPORT)
PREFILLED_SYRINGE | INTRAVENOUS | Status: AC
  Filled 2020-06-08: qty 10

## 2020-06-08 MED ORDER — IBUPROFEN 800 MG PO TABS: 800.0000 mg | ORAL_TABLET | Freq: Three times a day (TID) | ORAL | 0 refills | Status: DC | PRN

## 2020-06-08 MED ORDER — PROPOFOL 500 MG/50ML IV EMUL
INTRAVENOUS | Status: AC
  Filled 2020-06-08: qty 50

## 2020-06-08 MED ORDER — LACTATED RINGERS IV SOLN: INTRAVENOUS | Status: DC

## 2020-06-08 MED ORDER — PROPOFOL 10 MG/ML IV BOLUS
INTRAVENOUS | Status: DC | PRN
  Administered 2020-06-08: 200 mg via INTRAVENOUS

## 2020-06-08 MED ORDER — ONDANSETRON HCL 4 MG/2ML IJ SOLN
INTRAMUSCULAR | Status: DC | PRN
  Administered 2020-06-08: 4 mg via INTRAVENOUS

## 2020-06-08 SURGICAL SUPPLY — 19 items
CANISTER SUCT 1200ML W/VALVE (MISCELLANEOUS) ×2 IMPLANT
CATH ROBINSON RED A/P 16FR (CATHETERS) ×2 IMPLANT
DEVICE MYOSURE LITE (MISCELLANEOUS) IMPLANT
DEVICE MYOSURE REACH (MISCELLANEOUS) IMPLANT
DILATOR CANAL MILEX (MISCELLANEOUS) IMPLANT
GAUZE 4X4 16PLY RFD (DISPOSABLE) ×2 IMPLANT
GLOVE SURG ENC MOIS LTX SZ6.5 (GLOVE) ×2 IMPLANT
GLOVE SURG ENC TEXT LTX SZ6.5 (GLOVE) ×2 IMPLANT
GLOVE SURG UNDER POLY LF SZ7 (GLOVE) ×2 IMPLANT
GOWN STRL REUS W/ TWL LRG LVL3 (GOWN DISPOSABLE) ×2 IMPLANT
GOWN STRL REUS W/TWL LRG LVL3 (GOWN DISPOSABLE) ×4
KIT PROCEDURE FLUENT (KITS) ×2 IMPLANT
KIT TURNOVER KIT B (KITS) ×2 IMPLANT
PACK VAGINAL MINOR WOMEN LF (CUSTOM PROCEDURE TRAY) ×2 IMPLANT
PAD OB MATERNITY 4.3X12.25 (PERSONAL CARE ITEMS) ×2 IMPLANT
SEAL ROD LENS SCOPE MYOSURE (ABLATOR) ×2 IMPLANT
SLEEVE SCD COMPRESS KNEE MED (STOCKING) ×2 IMPLANT
TOWEL GREEN STERILE FF (TOWEL DISPOSABLE) ×4 IMPLANT
UNDERPAD 30X36 HEAVY ABSORB (UNDERPADS AND DIAPERS) ×2 IMPLANT

## 2020-06-08 NOTE — Interval H&P Note (Signed)
History and Physical Interval Note:  06/08/2020 10:47 AM  [name]  has presented today for surgery, with the diagnosis of Abnormal Uterine Bleeding (N93.9).  The various methods of treatment have been discussed with the patient and family. After consideration of risks, benefits and other options for treatment, the patient has consented to  Procedure(s): DILATATION & CURETTAGE/HYSTEROSCOPY WITH POSSIBLE MYOSURE POLYPECTOMY. (N/A) as a surgical intervention.  The patient's history has been reviewed, patient examined, no change in status, stable for surgery.  I have reviewed the patient's chart and labs.  Questions were answered to the patient's satisfaction.     Tracey Figueroa

## 2020-06-08 NOTE — Transfer of Care (Signed)
Immediate Anesthesia Transfer of Care Note  Patient: Tracey Figueroa  Procedure(s) Performed: DILATATION & CURETTAGE/ diagnostic HYSTEROSCOPY (N/A Uterus)  Patient Location: PACU  Anesthesia Type:General  Level of Consciousness: sedated  Airway & Oxygen Therapy: Patient Spontanous Breathing and Patient connected to face mask oxygen  Post-op Assessment: Report given to RN and Post -op Vital signs reviewed and stable  Post vital signs: Reviewed and stable  Last Vitals:  Vitals Value Taken Time  BP 135/72 06/08/20 1330  Temp 36.9 C 06/08/20 1305  Pulse 84 06/08/20 1339  Resp 16 06/08/20 1339  SpO2 92 % 06/08/20 1339  Vitals shown include unvalidated device data.  Last Pain:  Vitals:   06/08/20 1330  TempSrc:   PainSc: 2       Patients Stated Pain Goal: 2 (06/08/20 1330)  Complications: No complications documented.

## 2020-06-08 NOTE — Op Note (Addendum)
Pre Op Dx:  Abnormal uterine bleeding Post Op Dx:  Same as pre-op Procedure:  Hysteroscopy with Dilation and Curettage, Attempted Myosure Polypectomy   Surgeon:  Dr. Steva Ready Assistants:  None Anesthesia:  General   EBL:  10cc  IVF:  1000cc UOP:  Voided prior to arrival to OR Fluid Deficit:  300cc   Drains:  None Specimen removed:  Endometrial curettings - sent to pathology Device(s) implanted: None Case Type:  Clean-contaminated Findings:  Significant polyps throughout endometrial lining. Bilateral tubal ostia visualized. Complications: Technical difficulties with the scope, camera, and Myosure device which required inability to complete Myosure polypectomy. Indications:  55 y.o. G2P2 with abnormal uterine bleeding and history of symptomatic anemia requiring blood transfusion. Unable to perform EMB on outpatient attempt x 2 due to difficult angle of the cervix. Description of each procedure:  After informed consent was obtained the patient was taken to the operating room in the dorsal supine position.  After administration of general anesthesia, the patient was placed in the dorsal lithotomy position and prepped and draped in the usual sterile fashion. A pre-operative time-out was completed.  The anterior lip of the cervix was grasped with a single-tooth tenaculum and the cervix was serially dilated to accommodate the hysteroscope.  The hysteroscope was advanced and the findings as above was noted. Due to the technical difficulties as described above, only some polyps were resected using the Myosure Reach but unable to complete procedure.  A sharp and smooth banjo curette was used to curettage the endometrium. The single-tooth tenaculum was removed and its sites were made hemostatic with pressure and silver nitrate.  Adequate hemostasis was noted.  The patient was awakened and extubated and appeared to have tolerated the procedure well.  All counts were correct. Disposition:   PACU  Steva Ready, DO

## 2020-06-08 NOTE — Discharge Instructions (Signed)

## 2020-06-08 NOTE — Anesthesia Postprocedure Evaluation (Signed)
Anesthesia Post Note  Patient: Tracey Figueroa  Procedure(s) Performed: DILATATION & CURETTAGE/ diagnostic HYSTEROSCOPY (N/A Uterus)     Patient location during evaluation: PACU Anesthesia Type: General Level of consciousness: awake Pain management: pain level controlled Vital Signs Assessment: post-procedure vital signs reviewed and stable Respiratory status: spontaneous breathing Cardiovascular status: stable Postop Assessment: no apparent nausea or vomiting Anesthetic complications: no   No complications documented.  Last Vitals:  Vitals:   06/08/20 1339 06/08/20 1352  BP:  (!) 147/95  Pulse:  93  Resp:  18  Temp:  37 C  SpO2: 94% 95%    Last Pain:  Vitals:   06/08/20 1352  TempSrc:   PainSc: 2                   

## 2020-06-08 NOTE — Anesthesia Procedure Notes (Signed)
Procedure Name: LMA Insertion Performed by: Burna Cash, CRNA Pre-anesthesia Checklist: Patient identified, Emergency Drugs available, Suction available and Patient being monitored Patient Re-evaluated:Patient Re-evaluated prior to induction Oxygen Delivery Method: Circle system utilized Preoxygenation: Pre-oxygenation with 100% oxygen Induction Type: IV induction Ventilation: Mask ventilation without difficulty LMA: LMA inserted LMA Size: 4.0 Number of attempts: 1 Airway Equipment and Method: Bite block Placement Confirmation: positive ETCO2 and breath sounds checked- equal and bilateral Tube secured with: Tape Dental Injury: Teeth and Oropharynx as per pre-operative assessment

## 2020-06-11 ENCOUNTER — Encounter (HOSPITAL_BASED_OUTPATIENT_CLINIC_OR_DEPARTMENT_OTHER): Payer: Self-pay | Admitting: Obstetrics and Gynecology

## 2020-06-11 LAB — SURGICAL PATHOLOGY

## 2020-06-22 DIAGNOSIS — Z9889 Other specified postprocedural states: Secondary | ICD-10-CM | POA: Diagnosis not present

## 2020-06-22 DIAGNOSIS — N939 Abnormal uterine and vaginal bleeding, unspecified: Secondary | ICD-10-CM | POA: Diagnosis not present

## 2020-07-31 DIAGNOSIS — E1169 Type 2 diabetes mellitus with other specified complication: Secondary | ICD-10-CM | POA: Diagnosis not present

## 2020-07-31 DIAGNOSIS — E7800 Pure hypercholesterolemia, unspecified: Secondary | ICD-10-CM | POA: Diagnosis not present

## 2020-07-31 DIAGNOSIS — F39 Unspecified mood [affective] disorder: Secondary | ICD-10-CM | POA: Diagnosis not present

## 2020-07-31 DIAGNOSIS — D649 Anemia, unspecified: Secondary | ICD-10-CM | POA: Diagnosis not present

## 2020-09-03 ENCOUNTER — Other Ambulatory Visit: Payer: Self-pay

## 2020-09-03 ENCOUNTER — Encounter: Payer: Self-pay | Admitting: Allergy

## 2020-09-03 ENCOUNTER — Ambulatory Visit (INDEPENDENT_AMBULATORY_CARE_PROVIDER_SITE_OTHER): Payer: BC Managed Care – PPO | Admitting: Allergy

## 2020-09-03 VITALS — BP 136/84 | HR 86 | Temp 97.8°F | Ht 62.0 in | Wt 256.1 lb

## 2020-09-03 DIAGNOSIS — J4540 Moderate persistent asthma, uncomplicated: Secondary | ICD-10-CM

## 2020-09-03 DIAGNOSIS — J3089 Other allergic rhinitis: Secondary | ICD-10-CM | POA: Diagnosis not present

## 2020-09-03 MED ORDER — FLUTICASONE-SALMETEROL 232-14 MCG/ACT IN AEPB: 1.0000 | INHALATION_SPRAY | Freq: Two times a day (BID) | RESPIRATORY_TRACT | 5 refills | Status: DC

## 2020-09-03 NOTE — Assessment & Plan Note (Signed)
Past history - Rhinoconjunctivitis symptoms for the last 20+ years mainly in the spring and fall.  Takes Claritin as needed with good benefit. 2022 skin testing showed: Borderline positive to johnson grass only. Interim history - asymptomatic with no daily meds.  Continue environmental control measures as below.  Use over the counter antihistamines such as Zyrtec (cetirizine), Claritin (loratadine), Allegra (fexofenadine), or Xyzal (levocetirizine) daily as needed. May take twice a day during allergy flares. May switch antihistamines every few months.  Nasal saline spray (i.e., Simply Saline) is recommended as needed and prior to medicated nasal sprays.

## 2020-09-03 NOTE — Patient Instructions (Addendum)
Asthma: Daily controller medication(s): continue Airduo 1 puff once a day and rinse mouth after each use.  During upper respiratory infections/asthma flares: increase Airduo to 2 puff twice a day for 1-2 weeks until your breathing symptoms return to baseline.  May use albuterol rescue inhaler 2 puffs every 4 to 6 hours as needed for shortness of breath, chest tightness, coughing, and wheezing. May use albuterol rescue inhaler 2 puffs 5 to 15 minutes prior to strenuous physical activities. Monitor frequency of use.  Asthma control goals:  Full participation in all desired activities (may need albuterol before activity) Albuterol use two times or less a week on average (not counting use with activity) Cough interfering with sleep two times or less a month Oral steroids no more than once a year No hospitalizations  Environmental allergies 2022 skin testing showed positive to grass pollen.  Continue environmental control measures as below. Use over the counter antihistamines such as Zyrtec (cetirizine), Claritin (loratadine), Allegra (fexofenadine), or Xyzal (levocetirizine) daily as needed. May take twice a day during allergy flares. May switch antihistamines every few months. Nasal saline spray (i.e., Simply Saline) is recommended as needed and prior to medicated nasal sprays.  Follow up in 4 months or sooner if needed.   Reducing Pollen Exposure Pollen seasons: trees (spring), grass (summer) and ragweed/weeds (fall). Keep windows closed in your home and car to lower pollen exposure.  Install air conditioning in the bedroom and throughout the house if possible.  Avoid going out in dry windy days - especially early morning. Pollen counts are highest between 5 - 10 AM and on dry, hot and windy days.  Save outside activities for late afternoon or after a heavy rain, when pollen levels are lower.  Avoid mowing of grass if you have grass pollen allergy. Be aware that pollen can also be  transported indoors on people and pets.  Dry your clothes in an automatic dryer rather than hanging them outside where they might collect pollen.  Rinse hair and eyes before bedtime.

## 2020-09-03 NOTE — Assessment & Plan Note (Signed)
Past history - Diagnosed with asthma over 5+ years ago.  Currently on Advair discus 100 mcg 1 puff twice a day and using albuterol less than once a week with good benefit.  Had COVID-19 in January 2022. Severe anemia requiring hospitalization. 2022 spirometry showed some restriction with 7% improvement in FEV1 post bronchodilator treatment.  Clinically feeling improved. Interim history - doing much better with Ariduo 1 puff but only taking once a day. Anemia resolved.  Today's spirometry was normal.   Daily controller medication(s): continue Airduo 1 puff once a day and rinse mouth after each use.  . During upper respiratory infections/asthma flares: increase Airduo to 2 puff twice a day for 1-2 weeks until your breathing symptoms return to baseline.  . May use albuterol rescue inhaler 2 puffs every 4 to 6 hours as needed for shortness of breath, chest tightness, coughing, and wheezing. May use albuterol rescue inhaler 2 puffs 5 to 15 minutes prior to strenuous physical activities. Monitor frequency of use.   Get spirometry at next visit.

## 2020-09-03 NOTE — Progress Notes (Signed)
Follow Up Note  RE: Tracey Figueroa MRN: [id number] DOB: 29-Sep-1965 Date of Office Visit: 09/03/2020  Referring provider: Maurice Small, MD Primary care provider: Maurice Small, MD  Chief Complaint: Follow-up  History of Present Illness: I had the pleasure of seeing Tracey Figueroa for a follow up visit at the [facility] on 09/03/2020. She is a 55 y.o. female, who is being followed for asthma and allergic rhinitis. Her previous allergy office visit was on 05/18/2020 with Dr. Selena Batten. Today is a regular follow up visit.  Asthma Currently on Airduo 1 puff 1 times a day with good benefit. Used albuterol once since the last visit. Denies any SOB, coughing, wheezing, chest tightness, nocturnal awakenings, ER/urgent care visits or prednisone use since the last visit.  Anemia has improved and getting hysterectomy in September. Patient also lost weight.   Allergic rhinitis Not having any issues and not taking any daily antihistamines.   Assessment and Plan: Tracey Figueroa is a 55 y.o. female with: Moderate persistent asthma without complication Past history - Diagnosed with asthma over 5+ years ago.  Currently on Advair discus 100 mcg 1 puff twice a day and using albuterol less than once a week with good benefit.  Had COVID-19 in January 2022. Severe anemia requiring hospitalization. 2022 spirometry showed some restriction with 7% improvement in FEV1 post bronchodilator treatment.  Clinically feeling improved. Interim history - doing much better with Ariduo 1 puff but only taking once a day. Anemia resolved. Today's spirometry was normal.  Daily controller medication(s): continue Airduo 1 puff once a day and rinse mouth after each use.  During upper respiratory infections/asthma flares: increase Airduo to 2 puff twice a day for 1-2 weeks until your breathing symptoms return to baseline.  May use albuterol rescue inhaler 2 puffs every 4 to 6 hours as  needed for shortness of breath, chest tightness, coughing, and wheezing. May use albuterol rescue inhaler 2 puffs 5 to 15 minutes prior to strenuous physical activities. Monitor frequency of use.  Get spirometry at next visit.  Other allergic rhinitis Past history - Rhinoconjunctivitis symptoms for the last 20+ years mainly in the spring and fall.  Takes Claritin as needed with good benefit. 2022 skin testing showed: Borderline positive to johnson grass only. Interim history - asymptomatic with no daily meds. Continue environmental control measures as below. Use over the counter antihistamines such as Zyrtec (cetirizine), Claritin (loratadine), Allegra (fexofenadine), or Xyzal (levocetirizine) daily as needed. May take twice a day during allergy flares. May switch antihistamines every few months. Nasal saline spray (i.e., Simply Saline) is recommended as needed and prior to medicated nasal sprays.  Return in about 4 months (around 01/03/2021).  Meds ordered this encounter  Medications   Fluticasone-Salmeterol (AIRDUO RESPICLICK 232/14) 232-14 MCG/ACT AEPB    Sig: Inhale 1 puff into the lungs in the morning and at bedtime. Rinse mouth after each use.    Dispense:  1 each    Refill:  5    Lab Orders  No laboratory test(s) ordered today    Diagnostics: Spirometry:  Tracings reviewed. Her effort: Good reproducible efforts. FVC: 2.92L FEV1: 2.31L, 92% predicted FEV1/FVC ratio: 79% Interpretation: Spirometry consistent with normal pattern.  Please see scanned spirometry results for details.  Medication List:  Current Outpatient Medications  Medication Sig Dispense Refill   diclofenac Sodium (VOLTAREN) 1 % GEL See admin instructions.     docusate sodium (COLACE) 100 MG capsule Take 1 capsule (100  mg total) by mouth 2 (two) times daily. 60 capsule 3   ferrous sulfate 325 (65 FE) MG EC tablet Take 1 tablet (325 mg total) by mouth 2 (two) times daily with a meal. 60 tablet 3   ibuprofen  (ADVIL) 800 MG tablet Take 1 tablet (800 mg total) by mouth every 8 (eight) hours as needed. 30 tablet 0   Nirmatrelvir & Ritonavir 20 x 150 MG & 10 x 100MG  TBPK TAKE 3 TABLETS BY MOUTH 2 TIMES DAILY FOR 5 DAYS 30 each 0   Nutritional Supplements (JUICE PLUS FIBRE) LIQD See admin instructions.     sertraline (ZOLOFT) 100 MG tablet Take 100 mg by mouth daily.     tranexamic acid (LYSTEDA) 650 MG TABS tablet Take 1,300 mg by mouth 3 (three) times daily. As neded     UNABLE TO FIND DoTerra     Fluticasone-Salmeterol (AIRDUO RESPICLICK 232/14) 232-14 MCG/ACT AEPB Inhale 1 puff into the lungs in the morning and at bedtime. Rinse mouth after each use. 1 each 5   No current facility-administered medications for this visit.   Allergies: Allergies  Allergen Reactions   Erythromycin     Other reaction(s): stomach upset   Prednisone Other (See Comments)   I reviewed her past medical history, social history, family history, and environmental history and no significant changes have been reported from her previous visit.  Review of Systems  Constitutional:  Negative for appetite change, chills, fever and unexpected weight change.  HENT:  Negative for congestion, rhinorrhea and sneezing.   Eyes:  Negative for itching.  Respiratory:  Negative for cough, chest tightness, shortness of breath and wheezing.   Cardiovascular:  Negative for chest pain.  Gastrointestinal:  Negative for abdominal pain.  Genitourinary:  Negative for difficulty urinating.  Skin:  Negative for rash.  Allergic/Immunologic: Positive for environmental allergies.   Objective: BP 136/84   Pulse 86   Temp 97.8 F (36.6 C) (Temporal)   Ht 5\' 2"  (1.575 m)   Wt 256 lb 2 oz (116.2 kg)   SpO2 98%   BMI 46.85 kg/m  Body mass index is 46.85 kg/m. Physical Exam Vitals and nursing note reviewed.  Constitutional:      Appearance: Normal appearance. She is well-developed.  HENT:     Head: Normocephalic and atraumatic.     Right  Ear: Tympanic membrane and external ear normal.     Left Ear: Tympanic membrane and external ear normal.     Nose: Nose normal.     Mouth/Throat:     Mouth: Mucous membranes are moist.     Pharynx: Oropharynx is clear.  Eyes:     Conjunctiva/sclera: Conjunctivae normal.  Cardiovascular:     Rate and Rhythm: Normal rate and regular rhythm.     Heart sounds: Normal heart sounds. No murmur heard.   No friction rub. No gallop.  Pulmonary:     Effort: Pulmonary effort is normal.     Breath sounds: Normal breath sounds. No wheezing, rhonchi or rales.  Musculoskeletal:     Cervical back: Neck supple.  Skin:    General: Skin is warm.     Findings: No rash.  Neurological:     Mental Status: She is alert and oriented to person, place, and time.  Psychiatric:        Behavior: Behavior normal.  Previous notes and tests were reviewed. The plan was reviewed with the patient/family, and all questions/concerned were addressed.  It was my pleasure to see  Tracey Figueroa today and participate in her care. Please feel free to contact me with any questions or concerns.  Sincerely,  Wyline Mood, DO Allergy & Immunology  [facility] office: [phone] [facility] office: [phone]

## 2020-09-13 ENCOUNTER — Other Ambulatory Visit: Payer: Self-pay | Admitting: Allergy

## 2020-09-17 DIAGNOSIS — N939 Abnormal uterine and vaginal bleeding, unspecified: Secondary | ICD-10-CM | POA: Diagnosis not present

## 2020-09-17 DIAGNOSIS — Z01818 Encounter for other preprocedural examination: Secondary | ICD-10-CM | POA: Diagnosis not present

## 2020-09-18 NOTE — H&P (Signed)
Tracey Figueroa is an 55 y.o. G2P2 who is admitted for Robotic Assisted Total Laparoscopic Hysterectomy with Bilateral Salpingo-oophorectomy for AUB.  Patient began having heavy periods starting July/August 2021. Prior to this, her periods were normal and she was on birth control prior to this a few years ago. On 04/20/20, patient reported very heavy bleeding and needing to change an ultra tampon every 3-4 hours. CBC was drawn on 4/4 and Hgb was 6.5. She was direct admitted from the office for a blood transfusion. Unfortunately, outpatient attempt at EMB during this visit was unsuccessful.   During her hospital admission from 4/4-4/5, she received 3 units of packed red blood cells and started on Lysteda. Hgb rose appropriately to 9.0. Her bleeding subsided and she was discharged home in stable condition.   She returned to the office on 4/12 but EMB was unsuccessful again, suspected due to the angle of the cervix. We reviewed risks/benefits of outpatient procedure for adequate endometrial sampling in order to determine future management. She opted for Hysteroscopy D&C which was performed on 06/08/2020. Significant endometrial polyps were noted; however, due to difficulties with the camera scope and angle of the cerivx unable to complete Myosure polypectomy. Pathology revealed inactive endometrium and abundant blood, benign cervical and endocervical musoca, and no hyperplasia or carcinoma.  After review or risks/benefits of ongoing surveillance vs definitive surgical management, patient opts for definitive management via total hysterectomy. Given her age, she opts for oophorectomy as well.  TVUS (04/23/20): Uterus 10.9 x 9.3 x 5.6cm, no fibroids or other mass visualized, endometrium 63mm, no focal abnormality visualized. R ovary 2.6cm. No abnormal free fluid. Left ovary is not visualized. Endometrial thickness is within normallimits for a premenopausal patient. If bleeding remains unresponsive to hormonal or  medical therapy, sonohysterogram should be considered for focal lesion work-up. Pap smear (2019): NILM, endometrial cells present TSH (2021): Normal Hgb (07/31/20): 13.5  Patient Active Problem List   Diagnosis Date Noted   Moderate persistent asthma without complication 09/03/2020   Other allergic rhinitis 05/18/2020   Symptomatic anemia 04/23/2020   Diabetes mellitus type 2 in obese (HCC)    Obesity    COVID-19 02/19/2020   MEDICAL/FAMILY/SOCIAL HX: No LMP recorded.    Past Medical History:  Diagnosis Date   Abnormal uterine bleeding (AUB)    Anxiety    Arthritis    thumbs, back   Asthma    COVID-19 02/19/2020   Rx'd with Paxlovid   Depression    Obesity    Pre-diabetes     Past Surgical History:  Procedure Laterality Date   BACK SURGERY  2020   lumbar laminectomy   CESAREAN SECTION  03/1994   CESAREAN SECTION  06/1998   DILATATION & CURETTAGE/HYSTEROSCOPY WITH MYOSURE N/A 06/08/2020   Procedure: DILATATION & CURETTAGE/ diagnostic HYSTEROSCOPY;  Surgeon: Steva Ready, DO;  Location: [facility];  Service: Gynecology;  Laterality: N/A;    No family history on file.  Social History:  reports that she has never smoked. She has never used smokeless tobacco. She reports current alcohol use. She reports that she does not use drugs. PREVIOUS H&P noted in the social history that she endorsed drug use which was auto-populated in ERROR. Patient does not have any history of previous or current drug use.  ALLERGIES/MEDS:  Allergies:  Allergies  Allergen Reactions   Erythromycin     Other reaction(s): stomach upset   Prednisone Other (See Comments)    No medications prior to admission.  Review of Systems  Constitutional: Negative.   HENT: Negative.    Eyes: Negative.   Respiratory: Negative.    Cardiovascular: Negative.   Gastrointestinal: Negative.   Genitourinary: Negative.   Musculoskeletal: Negative.   Skin: Negative.   Neurological:  Negative.   Endo/Heme/Allergies: Negative.   Psychiatric/Behavioral: Negative.     There were no vitals taken for this visit. Gen:  NAD, pleasant and cooperative Cardio:  RRR Pulm:  CTAB, no wheezes/rales/rhonchi Abd:  Soft, non-distended, non-tender throughout, no rebound/guarding Ext:  No bilateral LE edema, no bilateral calf tenderness Pelvic: Normal external genitalia, cervix difficult to visualize on exam but is normal in appearance, vagina - pink/moist mucosa, no lesions or abnormal discharge, vulva - normal, no lesions, no skin discoloration  No results found for this or any previous visit (from the past 24 hour(s)).  No results found.   ASSESSMENT/PLAN: Tracey Figueroa is a 55 y.o. G2P2 who is admitted for Robotic Assisted Total Laparoscopic Hysterectomy with Bilateral Salpingo-oophorectomy for AUB.  - Admit to [facility] - Admit labs (CBC, BMP, T&S, COVID screen) - Diet:  NPO - IVF:  Per anesthesia - VTE Prophylaxis:  SCDs - Antibiotics: Ancef 2g on call to OR  Consents: I have explained to the patient that his surgery is performed to remove the uterus through several small incisions in the abdomen and that it will result in sterility.  I discussed the risks and benefits of the surgery, including, but not limited to bleeding, including the need for blood transfusion, infection, damage to surround organs and tissues, damage to bladder, damage to ureters, causing kidney damage, and requiring additional procedures, damage to bowels, resulting in further surgery, postoperative pain, short-term and long-term, scarring on the abdominal wall and intra-abdominally, need for further surgery, need for conversion to an open procedure, development of an incisional hernia, deep vein thrombosis, and/or pulmonary embolism, wound infection and/or separation, painful intercourse, urinary leakage, ovarian failure, resulting in menopausal symptoms requiring treatment, fistula formation, complications  the course of which cannot be predicted or prevented, and death. Patient was counseled on prophylactic oophorectomy versus ovarian conservation, understanding that ovarian conservation carries a lifetime risk of ovarian cancer of 1 in 85 and a 5-10% risk of reoperation in the future for ovarian pathology.  She understands that prophylactic oophorectomy may result in menopausal symptoms resulting in the need to hormone replacement therapy following surgery.  The patient opts for bilateral oophorectomy. Patient was consented for blood products.  The patient is aware that bleeding may result in the need for a blood transfusion which includes risk of transmission of HIV (1:2 million), Hepatitis C (1:2 million), and Hepatitis B (1:200 thousand) and transfusion reaction.  Patient voiced understanding of the above risks as well as understanding of indications for blood transfusion.  Steva Ready, DO [phone] (office)

## 2020-09-27 ENCOUNTER — Other Ambulatory Visit: Payer: Self-pay

## 2020-09-27 ENCOUNTER — Encounter (HOSPITAL_BASED_OUTPATIENT_CLINIC_OR_DEPARTMENT_OTHER): Payer: Self-pay | Admitting: Obstetrics and Gynecology

## 2020-09-27 DIAGNOSIS — Z973 Presence of spectacles and contact lenses: Secondary | ICD-10-CM

## 2020-09-27 HISTORY — DX: Presence of spectacles and contact lenses: Z97.3

## 2020-09-27 NOTE — Progress Notes (Addendum)
YOU ARE SCHEDULED FOR A COVID TEST ON   10-01-2020  . THIS TEST MUST BE DONE BEFORE SURGERY. GO TO  [facility] @ [address] [place]   PHONE NUMBER [phone].   TURN LEFT AT THE SHIPPING AND RECEIVING SIGN AND LOOK FOR SMALL BLUE POP UP TENT UNDER BUILDING OVERHANG AT BACK OF BUILDING AND REMAIN IN YOUR CAR, THIS IS A DRIVE UP TEST.  AFTER YOUR COVID TEST , PLEASE WEAR A MASK OUT IN PUBLIC AND SOCIAL DISTANCE AND WASH YOUR HANDS FREQUENTLY. PLEASE ASK ALL YOUR CLOSE HOUSEHOLD CONTACT TO WEAR MASK OUT IN PUBLIC AND SOCIAL DISTANCE AND WASH HANDS FREQUENTLY ALSO.      Your procedure is scheduled on 10-03-2020  Report to [facility] AT  630  A. M.   Call this number if you have problems the morning of surgery  [phone].   OUR ADDRESS IS [address].  WE ARE LOCATED IN [facility].  PLEASE BRING YOUR INSURANCE CARD AND PHOTO ID DAY OF SURGERY.  ONLY ONE PERSON ALLOWED IN FACILITY WAITING AREA.                                     REMEMBER:  DO NOT EAT FOOD, CANDY GUM OR MINTS  AFTER MIDNIGHT . YOU MAY HAVE CLEAR LIQUIDS FROM MIDNIGHT UNTIL 530 AM. NO CLEAR LIQUIDS AFTER  530 AM DAY OF SURGERY.   YOU MAY  BRUSH YOUR TEETH MORNING OF SURGERY AND RINSE YOUR MOUTH OUT, NO CHEWING GUM CANDY OR MINTS.    CLEAR LIQUID DIET   Foods Allowed                                                                     Foods Excluded  Coffee and tea, regular and decaf                             liquids that you cannot  Plain Jell-O any favor except red or purple                                           see through such as: Fruit ices (not with fruit pulp)                                     milk, soups, orange juice  Iced Popsicles                                    All solid food Carbonated beverages, regular and diet                                    Cranberry, grape and apple juices Sports drinks like Gatorade Lightly  seasoned  clear broth or consume(fat free) Sugar  Sample Menu Breakfast                                Lunch                                     Supper Cranberry juice                    Beef broth                            Chicken broth Jell-O                                     Grape juice                           Apple juice Coffee or tea                        Jell-O                                      Popsicle                                                Coffee or tea                        Coffee or tea  _____________________________________________________________________     TAKE THESE MEDICATIONS MORNING OF SURGERY WITH A SIP OF WATER:  SERTRALINE, USE/BRING YOUR INHALER   ONE VISITOR IS ALLOWED IN WAITING ROOM ONLY DAY OF SURGERY.  NO VISITOR MAY SPEND THE NIGHT.  VISITOR ARE ALLOWED TO STAY UNTIL 800 PM.                                    DO NOT WEAR JEWERLY, MAKE UP. DO NOT WEAR LOTIONS, POWDERS, PERFUMES OR NAIL POLISH. DO NOT SHAVE FOR 48 HOURS PRIOR TO DAY OF SURGERY. MEN MAY SHAVE FACE AND NECK. CONTACTS, GLASSES, OR DENTURES MAY NOT BE WORN TO SURGERY.                                    [facility] IS NOT RESPONSIBLE  FOR ANY BELONGINGS.                                                                    Marland Kitchen           [facility] - Preparing for Surgery Before surgery, you can play  an important role.  Because skin is not sterile, your skin needs to be as free of germs as possible.  You can reduce the number of germs on your skin by washing with CHG (chlorahexidine gluconate) soap before surgery.  CHG is an antiseptic cleaner which kills germs and bonds with the skin to continue killing germs even after washing. Please DO NOT use if you have an allergy to CHG or antibacterial soaps.  If your skin becomes reddened/irritated stop using the CHG and inform your nurse when you arrive at Short Stay. Do not shave (including legs and underarms) for at least 48 hours prior to the  first CHG shower.  You may shave your face/neck. Please follow these instructions carefully:  1.  Shower with CHG Soap the night before surgery and the  morning of Surgery.  2.  If you choose to wash your hair, wash your hair first as usual with your  normal  shampoo.  3.  After you shampoo, rinse your hair and body thoroughly to remove the  shampoo.                            4.  Use CHG as you would any other liquid soap.  You can apply chg directly  to the skin and wash                      Gently with a scrungie or clean washcloth.  5.  Apply the CHG Soap to your body ONLY FROM THE NECK DOWN.   Do not use on face/ open                           Wound or open sores. Avoid contact with eyes, ears mouth and genitals (private parts).                       Wash face,  Genitals (private parts) with your normal soap.             6.  Wash thoroughly, paying special attention to the area where your surgery  will be performed.  7.  Thoroughly rinse your body with warm water from the neck down.  8.  DO NOT shower/wash with your normal soap after using and rinsing off  the CHG Soap.                9.  Pat yourself dry with a clean towel.            10.  Wear clean pajamas.            11.  Place clean sheets on your bed the night of your first shower and do not  sleep with pets. Day of Surgery : Do not apply any lotions/deodorants the morning of surgery.  Please wear clean clothes to the hospital/surgery center.  FAILURE TO FOLLOW THESE INSTRUCTIONS MAY RESULT IN THE CANCELLATION OF YOUR SURGERY PATIENT SIGNATURE_________________________________  NURSE SIGNATURE__________________________________  ________________________________________________________________________                                                        QUESTIONS Annye Asa PRE OP NURSE PHONE [phone].

## 2020-09-27 NOTE — Progress Notes (Signed)
Spoke w/ via phone for pre-op interview---pt Lab needs dos----   urine poct            Lab results------[facility] 06-06-2020 chart/epic COVID test -----10-01-2020 overnight stay Arrive at -------630 am 10-03-2020 NPO after MN NO Solid Food.  Clear liquids from MN until---530 am Med rec completed Medications to take morning of surgery -----zoloft, use bring inhaler Diabetic medication -----n/a Patient instructed no nail polish to be worn day of surgery Patient instructed to bring photo id and insurance card day of surgery Patient aware to have Driver (ride ) / caregiver   best friend Chestine Spore  for 24 hours after surgery  Patient Special Instructions -----pt given overnight stay instructions Pre-Op special Istructions -----none Patient verbalized understanding of instructions that were given at this phone interview. Patient denies shortness of breath, chest pain, fever, cough at this phone interview.

## 2020-10-01 ENCOUNTER — Encounter (HOSPITAL_COMMUNITY)
Admission: RE | Admit: 2020-10-01 | Discharge: 2020-10-01 | Disposition: A | Discharge status: Home / Self Care | Payer: BC Managed Care – PPO | Source: Ambulatory Visit | Attending: Obstetrics and Gynecology | Admitting: Obstetrics and Gynecology

## 2020-10-01 ENCOUNTER — Other Ambulatory Visit: Payer: Self-pay | Admitting: Obstetrics and Gynecology

## 2020-10-01 ENCOUNTER — Other Ambulatory Visit: Payer: Self-pay

## 2020-10-01 DIAGNOSIS — D649 Anemia, unspecified: Secondary | ICD-10-CM | POA: Diagnosis not present

## 2020-10-01 DIAGNOSIS — Z01812 Encounter for preprocedural laboratory examination: Secondary | ICD-10-CM | POA: Insufficient documentation

## 2020-10-01 DIAGNOSIS — D251 Intramural leiomyoma of uterus: Secondary | ICD-10-CM | POA: Diagnosis not present

## 2020-10-01 DIAGNOSIS — N800 Endometriosis of uterus: Secondary | ICD-10-CM | POA: Diagnosis not present

## 2020-10-01 DIAGNOSIS — Z8616 Personal history of COVID-19: Secondary | ICD-10-CM | POA: Diagnosis not present

## 2020-10-01 DIAGNOSIS — E119 Type 2 diabetes mellitus without complications: Secondary | ICD-10-CM | POA: Diagnosis not present

## 2020-10-01 DIAGNOSIS — E669 Obesity, unspecified: Secondary | ICD-10-CM | POA: Diagnosis not present

## 2020-10-01 DIAGNOSIS — Y836 Removal of other organ (partial) (total) as the cause of abnormal reaction of the patient, or of later complication, without mention of misadventure at the time of the procedure: Secondary | ICD-10-CM | POA: Diagnosis not present

## 2020-10-01 DIAGNOSIS — N736 Female pelvic peritoneal adhesions (postinfective): Secondary | ICD-10-CM | POA: Diagnosis not present

## 2020-10-01 DIAGNOSIS — F419 Anxiety disorder, unspecified: Secondary | ICD-10-CM | POA: Diagnosis not present

## 2020-10-01 DIAGNOSIS — J4540 Moderate persistent asthma, uncomplicated: Secondary | ICD-10-CM | POA: Diagnosis not present

## 2020-10-01 DIAGNOSIS — S3141XA Laceration without foreign body of vagina and vulva, initial encounter: Secondary | ICD-10-CM | POA: Diagnosis not present

## 2020-10-01 DIAGNOSIS — L7612 Accidental puncture and laceration of skin and subcutaneous tissue during other procedure: Secondary | ICD-10-CM | POA: Diagnosis not present

## 2020-10-01 DIAGNOSIS — N939 Abnormal uterine and vaginal bleeding, unspecified: Secondary | ICD-10-CM | POA: Diagnosis not present

## 2020-10-01 DIAGNOSIS — Z6841 Body Mass Index (BMI) 40.0 and over, adult: Secondary | ICD-10-CM | POA: Diagnosis not present

## 2020-10-01 DIAGNOSIS — F32A Depression, unspecified: Secondary | ICD-10-CM | POA: Diagnosis not present

## 2020-10-01 LAB — CBC WITH DIFFERENTIAL/PLATELET
Abs Immature Granulocytes: 0.02 10*3/uL (ref 0.00–0.07)
Basophils Absolute: 0.1 10*3/uL (ref 0.0–0.1)
Basophils Relative: 1 %
Eosinophils Absolute: 0.2 10*3/uL (ref 0.0–0.5)
Eosinophils Relative: 2 %
HCT: 43.1 % (ref 36.0–46.0)
Hemoglobin: 13.2 g/dL (ref 12.0–15.0)
Immature Granulocytes: 0 %
Lymphocytes Relative: 24 %
Lymphs Abs: 1.8 10*3/uL (ref 0.7–4.0)
MCH: 26.7 pg (ref 26.0–34.0)
MCHC: 30.6 g/dL (ref 30.0–36.0)
MCV: 87.1 fL (ref 80.0–100.0)
Monocytes Absolute: 0.5 10*3/uL (ref 0.1–1.0)
Monocytes Relative: 7 %
Neutro Abs: 4.9 10*3/uL (ref 1.7–7.7)
Neutrophils Relative %: 66 %
Platelets: 333 10*3/uL (ref 150–400)
RBC: 4.95 MIL/uL (ref 3.87–5.11)
RDW: 15.7 % — ABNORMAL HIGH (ref 11.5–15.5)
WBC: 7.4 10*3/uL (ref 4.0–10.5)
nRBC: 0 % (ref 0.0–0.2)

## 2020-10-01 LAB — BASIC METABOLIC PANEL
Anion gap: 10 (ref 5–15)
BUN: 17 mg/dL (ref 6–20)
CO2: 25 mmol/L (ref 22–32)
Calcium: 9.2 mg/dL (ref 8.9–10.3)
Chloride: 104 mmol/L (ref 98–111)
Creatinine, Ser: 0.71 mg/dL (ref 0.44–1.00)
GFR, Estimated: >60 mL/min (ref >60)
Glucose, Bld: 132 mg/dL — ABNORMAL HIGH (ref 70–99)
Potassium: 4.7 mmol/L (ref 3.5–5.1)
Sodium: 139 mmol/L (ref 135–145)

## 2020-10-01 LAB — SARS CORONAVIRUS 2 (TAT 6-24 HRS): SARS Coronavirus 2: NEGATIVE

## 2020-10-03 ENCOUNTER — Inpatient Hospital Stay (HOSPITAL_BASED_OUTPATIENT_CLINIC_OR_DEPARTMENT_OTHER)
Admission: AD | Admit: 2020-10-03 | Discharge: 2020-10-05 | DRG: 742 | Disposition: A | Discharge status: Home / Self Care | Payer: BC Managed Care – PPO | Source: Ambulatory Visit | Attending: Obstetrics and Gynecology | Admitting: Obstetrics and Gynecology

## 2020-10-03 ENCOUNTER — Other Ambulatory Visit: Payer: Self-pay

## 2020-10-03 ENCOUNTER — Ambulatory Visit (HOSPITAL_BASED_OUTPATIENT_CLINIC_OR_DEPARTMENT_OTHER): Payer: BC Managed Care – PPO | Admitting: Anesthesiology

## 2020-10-03 ENCOUNTER — Encounter (HOSPITAL_COMMUNITY)
Admission: AD | Disposition: A | Discharge status: Home / Self Care | Payer: Self-pay | Source: Ambulatory Visit | Attending: Obstetrics and Gynecology

## 2020-10-03 ENCOUNTER — Encounter (HOSPITAL_BASED_OUTPATIENT_CLINIC_OR_DEPARTMENT_OTHER): Payer: Self-pay | Admitting: Obstetrics and Gynecology

## 2020-10-03 DIAGNOSIS — Y836 Removal of other organ (partial) (total) as the cause of abnormal reaction of the patient, or of later complication, without mention of misadventure at the time of the procedure: Secondary | ICD-10-CM | POA: Diagnosis not present

## 2020-10-03 DIAGNOSIS — E669 Obesity, unspecified: Secondary | ICD-10-CM | POA: Diagnosis present

## 2020-10-03 DIAGNOSIS — N736 Female pelvic peritoneal adhesions (postinfective): Secondary | ICD-10-CM | POA: Diagnosis present

## 2020-10-03 DIAGNOSIS — Z6841 Body Mass Index (BMI) 40.0 and over, adult: Secondary | ICD-10-CM

## 2020-10-03 DIAGNOSIS — J4540 Moderate persistent asthma, uncomplicated: Secondary | ICD-10-CM | POA: Diagnosis not present

## 2020-10-03 DIAGNOSIS — Z8616 Personal history of COVID-19: Secondary | ICD-10-CM

## 2020-10-03 DIAGNOSIS — F32A Depression, unspecified: Secondary | ICD-10-CM | POA: Diagnosis present

## 2020-10-03 DIAGNOSIS — N939 Abnormal uterine and vaginal bleeding, unspecified: Principal | ICD-10-CM | POA: Diagnosis present

## 2020-10-03 DIAGNOSIS — F419 Anxiety disorder, unspecified: Secondary | ICD-10-CM | POA: Diagnosis present

## 2020-10-03 DIAGNOSIS — L7612 Accidental puncture and laceration of skin and subcutaneous tissue during other procedure: Secondary | ICD-10-CM | POA: Diagnosis not present

## 2020-10-03 DIAGNOSIS — E119 Type 2 diabetes mellitus without complications: Secondary | ICD-10-CM | POA: Diagnosis present

## 2020-10-03 DIAGNOSIS — D649 Anemia, unspecified: Secondary | ICD-10-CM | POA: Diagnosis not present

## 2020-10-03 DIAGNOSIS — S3141XA Laceration without foreign body of vagina and vulva, initial encounter: Secondary | ICD-10-CM | POA: Diagnosis not present

## 2020-10-03 HISTORY — PX: CYSTOSCOPY: SHX5120

## 2020-10-03 HISTORY — PX: HYSTERECTOMY ABDOMINAL WITH SALPINGO-OOPHORECTOMY: SHX6792

## 2020-10-03 HISTORY — PX: ROBOTIC ASSISTED LAPAROSCOPIC HYSTERECTOMY AND SALPINGECTOMY: SHX6379

## 2020-10-03 HISTORY — DX: Dyspnea, unspecified: R06.00

## 2020-10-03 HISTORY — DX: Anemia, unspecified: D64.9

## 2020-10-03 LAB — TYPE AND SCREEN
ABO/RH(D): O POS
Antibody Screen: POSITIVE
DAT, IgG: NEGATIVE
PT AG Type: NEGATIVE
Unit division: 0
Unit division: 0

## 2020-10-03 LAB — BPAM RBC
Blood Product Expiration Date: 202210172359
Blood Product Expiration Date: 202210172359
Unit Type and Rh: 5100
Unit Type and Rh: 5100

## 2020-10-03 LAB — GLUCOSE, CAPILLARY: Glucose-Capillary: 121 mg/dL — ABNORMAL HIGH (ref 70–99)

## 2020-10-03 LAB — POCT PREGNANCY, URINE: Preg Test, Ur: NEGATIVE

## 2020-10-03 SURGERY — XI ROBOTIC ASSISTED LAPAROSCOPIC HYSTERECTOMY AND SALPINGECTOMY
Anesthesia: General | Site: Vulva | Laterality: Bilateral

## 2020-10-03 MED ORDER — SODIUM CHLORIDE (PF) 0.9 % IJ SOLN
INTRAMUSCULAR | Status: AC
  Filled 2020-10-03: qty 10

## 2020-10-03 MED ORDER — ROCURONIUM BROMIDE 10 MG/ML (PF) SYRINGE
PREFILLED_SYRINGE | INTRAVENOUS | Status: AC
  Filled 2020-10-03: qty 10

## 2020-10-03 MED ORDER — LIDOCAINE HCL (PF) 2 % IJ SOLN
INTRAMUSCULAR | Status: AC
  Filled 2020-10-03: qty 20

## 2020-10-03 MED ORDER — PHENYLEPHRINE 40 MCG/ML (10ML) SYRINGE FOR IV PUSH (FOR BLOOD PRESSURE SUPPORT)
PREFILLED_SYRINGE | INTRAVENOUS | Status: AC
  Filled 2020-10-03: qty 10

## 2020-10-03 MED ORDER — LACTATED RINGERS IV SOLN: INTRAVENOUS | Status: DC

## 2020-10-03 MED ORDER — OXYCODONE HCL 5 MG PO TABS
5.0000 mg | ORAL_TABLET | ORAL | Status: DC | PRN
  Administered 2020-10-03 – 2020-10-04 (×3): 10 mg via ORAL
  Filled 2020-10-03 (×3): qty 2

## 2020-10-03 MED ORDER — ARTIFICIAL TEARS OPHTHALMIC OINT
TOPICAL_OINTMENT | OPHTHALMIC | Status: AC
  Filled 2020-10-03: qty 3.5

## 2020-10-03 MED ORDER — INDIGOTINDISULFONATE SODIUM 8 MG/ML IJ SOLN
INTRAMUSCULAR | Status: DC | PRN
  Administered 2020-10-03: 40 mg via INTRAVENOUS

## 2020-10-03 MED ORDER — MIDAZOLAM HCL 2 MG/2ML IJ SOLN
INTRAMUSCULAR | Status: AC
  Filled 2020-10-03: qty 2

## 2020-10-03 MED ORDER — MORPHINE SULFATE (PF) 2 MG/ML IV SOLN: 1.0000 mg | INTRAVENOUS | Status: DC | PRN

## 2020-10-03 MED ORDER — ALBUMIN HUMAN 5 % IV SOLN: INTRAVENOUS | Status: DC | PRN

## 2020-10-03 MED ORDER — PHENYLEPHRINE 40 MCG/ML (10ML) SYRINGE FOR IV PUSH (FOR BLOOD PRESSURE SUPPORT)
PREFILLED_SYRINGE | INTRAVENOUS | Status: DC | PRN
  Administered 2020-10-03 (×5): 80 ug via INTRAVENOUS

## 2020-10-03 MED ORDER — FENTANYL CITRATE (PF) 100 MCG/2ML IJ SOLN
25.0000 ug | INTRAMUSCULAR | Status: DC | PRN
  Administered 2020-10-03: 25 ug via INTRAVENOUS
  Administered 2020-10-03: 50 ug via INTRAVENOUS
  Administered 2020-10-03: 25 ug via INTRAVENOUS

## 2020-10-03 MED ORDER — FENTANYL CITRATE (PF) 250 MCG/5ML IJ SOLN
INTRAMUSCULAR | Status: AC
  Filled 2020-10-03: qty 5

## 2020-10-03 MED ORDER — METHYLENE BLUE 0.5 % INJ SOLN
INTRAVENOUS | Status: AC
  Filled 2020-10-03: qty 10

## 2020-10-03 MED ORDER — CEFAZOLIN SODIUM-DEXTROSE 2-4 GM/100ML-% IV SOLN
2.0000 g | INTRAVENOUS | Status: AC
  Administered 2020-10-03 (×2): 2 g via INTRAVENOUS

## 2020-10-03 MED ORDER — ONDANSETRON HCL 4 MG/2ML IJ SOLN
INTRAMUSCULAR | Status: AC
  Filled 2020-10-03: qty 2

## 2020-10-03 MED ORDER — ROCURONIUM BROMIDE 10 MG/ML (PF) SYRINGE
PREFILLED_SYRINGE | INTRAVENOUS | Status: DC | PRN
  Administered 2020-10-03: 20 mg via INTRAVENOUS
  Administered 2020-10-03: 60 mg via INTRAVENOUS
  Administered 2020-10-03 (×4): 20 mg via INTRAVENOUS

## 2020-10-03 MED ORDER — SUGAMMADEX SODIUM 200 MG/2ML IV SOLN
INTRAVENOUS | Status: DC | PRN
  Administered 2020-10-03: 200 mg via INTRAVENOUS

## 2020-10-03 MED ORDER — CEFAZOLIN SODIUM-DEXTROSE 2-4 GM/100ML-% IV SOLN
INTRAVENOUS | Status: AC
  Filled 2020-10-03: qty 100

## 2020-10-03 MED ORDER — LIDOCAINE 2% (20 MG/ML) 5 ML SYRINGE
INTRAMUSCULAR | Status: DC | PRN
  Administered 2020-10-03: 60 mg via INTRAVENOUS

## 2020-10-03 MED ORDER — ACETAMINOPHEN 500 MG PO TABS: 1000.0000 mg | ORAL_TABLET | Freq: Four times a day (QID) | ORAL | Status: DC | PRN

## 2020-10-03 MED ORDER — LIDOCAINE HCL (PF) 2 % IJ SOLN
INTRAMUSCULAR | Status: AC
  Filled 2020-10-03: qty 5

## 2020-10-03 MED ORDER — PROPOFOL 10 MG/ML IV BOLUS
INTRAVENOUS | Status: DC | PRN
  Administered 2020-10-03: 170 mg via INTRAVENOUS

## 2020-10-03 MED ORDER — KETOROLAC TROMETHAMINE 30 MG/ML IJ SOLN
INTRAMUSCULAR | Status: DC | PRN
  Administered 2020-10-03: 30 mg via INTRAVENOUS

## 2020-10-03 MED ORDER — EPHEDRINE 5 MG/ML INJ
INTRAVENOUS | Status: AC
  Filled 2020-10-03: qty 5

## 2020-10-03 MED ORDER — SODIUM CHLORIDE 0.9 % IR SOLN
Status: DC | PRN
  Administered 2020-10-03 (×2): 1000 mL

## 2020-10-03 MED ORDER — MIDAZOLAM HCL 5 MG/5ML IJ SOLN
INTRAMUSCULAR | Status: DC | PRN
  Administered 2020-10-03: 2 mg via INTRAVENOUS

## 2020-10-03 MED ORDER — GLYCOPYRROLATE PF 0.2 MG/ML IJ SOSY
PREFILLED_SYRINGE | INTRAMUSCULAR | Status: AC
  Filled 2020-10-03: qty 1

## 2020-10-03 MED ORDER — DEXAMETHASONE SODIUM PHOSPHATE 10 MG/ML IJ SOLN
INTRAMUSCULAR | Status: DC | PRN
  Administered 2020-10-03: 5 mg via INTRAVENOUS

## 2020-10-03 MED ORDER — FENTANYL CITRATE (PF) 100 MCG/2ML IJ SOLN
INTRAMUSCULAR | Status: AC
  Filled 2020-10-03: qty 2

## 2020-10-03 MED ORDER — KETOROLAC TROMETHAMINE 30 MG/ML IJ SOLN
30.0000 mg | Freq: Four times a day (QID) | INTRAMUSCULAR | Status: AC
  Administered 2020-10-03 – 2020-10-04 (×4): 30 mg via INTRAVENOUS
  Filled 2020-10-03 (×4): qty 1

## 2020-10-03 MED ORDER — ONDANSETRON HCL 4 MG/2ML IJ SOLN: INTRAMUSCULAR | Status: DC | PRN

## 2020-10-03 MED ORDER — PANTOPRAZOLE SODIUM 40 MG IV SOLR
40.0000 mg | Freq: Every day | INTRAVENOUS | Status: DC
  Administered 2020-10-03 – 2020-10-04 (×2): 40 mg via INTRAVENOUS
  Filled 2020-10-03 (×2): qty 40

## 2020-10-03 MED ORDER — GLYCOPYRROLATE PF 0.2 MG/ML IJ SOSY
PREFILLED_SYRINGE | INTRAMUSCULAR | Status: DC | PRN
  Administered 2020-10-03: .2 mg via INTRAVENOUS

## 2020-10-03 MED ORDER — PHENYLEPHRINE HCL (PRESSORS) 10 MG/ML IV SOLN
INTRAVENOUS | Status: AC
  Filled 2020-10-03: qty 1

## 2020-10-03 MED ORDER — PHENYLEPHRINE HCL-NACL 20-0.9 MG/250ML-% IV SOLN
INTRAVENOUS | Status: DC | PRN
  Administered 2020-10-03: 20 ug/min via INTRAVENOUS

## 2020-10-03 MED ORDER — ACETAMINOPHEN 10 MG/ML IV SOLN: 1000.0000 mg | Freq: Once | INTRAVENOUS | Status: DC | PRN

## 2020-10-03 MED ORDER — HYDROMORPHONE HCL 2 MG/ML IJ SOLN
INTRAMUSCULAR | Status: AC
  Filled 2020-10-03: qty 1

## 2020-10-03 MED ORDER — ONDANSETRON HCL 4 MG/2ML IJ SOLN: 4.0000 mg | Freq: Four times a day (QID) | INTRAMUSCULAR | Status: DC | PRN

## 2020-10-03 MED ORDER — KETOROLAC TROMETHAMINE 30 MG/ML IJ SOLN
INTRAMUSCULAR | Status: AC
  Filled 2020-10-03: qty 1

## 2020-10-03 MED ORDER — ZOLPIDEM TARTRATE 5 MG PO TABS: 5.0000 mg | ORAL_TABLET | Freq: Every evening | ORAL | Status: DC | PRN

## 2020-10-03 MED ORDER — STERILE WATER FOR IRRIGATION IR SOLN
Status: DC | PRN
  Administered 2020-10-03: 3000 mL

## 2020-10-03 MED ORDER — STERILE WATER FOR IRRIGATION IR SOLN
Status: DC | PRN
  Administered 2020-10-03: 500 mL

## 2020-10-03 MED ORDER — SERTRALINE HCL 100 MG PO TABS
100.0000 mg | ORAL_TABLET | Freq: Every day | ORAL | Status: DC
  Administered 2020-10-04 – 2020-10-05 (×2): 100 mg via ORAL
  Filled 2020-10-03 (×2): qty 1

## 2020-10-03 MED ORDER — SODIUM CHLORIDE 0.9 % IV SOLN
INTRAVENOUS | Status: DC | PRN
  Administered 2020-10-03: 100 mL

## 2020-10-03 MED ORDER — CEFAZOLIN SODIUM 1 G IJ SOLR
INTRAMUSCULAR | Status: AC
  Filled 2020-10-03: qty 20

## 2020-10-03 MED ORDER — FENTANYL CITRATE (PF) 100 MCG/2ML IJ SOLN
INTRAMUSCULAR | Status: DC | PRN
  Administered 2020-10-03 (×3): 50 ug via INTRAVENOUS
  Administered 2020-10-03: 100 ug via INTRAVENOUS

## 2020-10-03 MED ORDER — PROPOFOL 10 MG/ML IV BOLUS
INTRAVENOUS | Status: AC
  Filled 2020-10-03: qty 20

## 2020-10-03 MED ORDER — PROMETHAZINE HCL 25 MG/ML IJ SOLN: 6.2500 mg | INTRAMUSCULAR | Status: DC | PRN

## 2020-10-03 MED ORDER — ONDANSETRON HCL 4 MG/2ML IJ SOLN
INTRAMUSCULAR | Status: DC | PRN
  Administered 2020-10-03: 4 mg via INTRAVENOUS

## 2020-10-03 MED ORDER — IBUPROFEN 400 MG PO TABS
600.0000 mg | ORAL_TABLET | Freq: Four times a day (QID) | ORAL | Status: DC
  Administered 2020-10-04 – 2020-10-05 (×3): 600 mg via ORAL
  Filled 2020-10-03 (×3): qty 1

## 2020-10-03 MED ORDER — SENNOSIDES-DOCUSATE SODIUM 8.6-50 MG PO TABS
1.0000 | ORAL_TABLET | Freq: Every evening | ORAL | Status: DC | PRN
  Filled 2020-10-03: qty 1

## 2020-10-03 MED ORDER — MORPHINE SULFATE (PF) 4 MG/ML IV SOLN: 1.0000 mg | INTRAVENOUS | Status: DC | PRN

## 2020-10-03 MED ORDER — HYDROMORPHONE HCL 1 MG/ML IJ SOLN
INTRAMUSCULAR | Status: DC | PRN
  Administered 2020-10-03: 1 mg via INTRAVENOUS

## 2020-10-03 MED ORDER — LIDOCAINE HCL (PF) 2 % IJ SOLN
INTRAMUSCULAR | Status: DC | PRN
  Administered 2020-10-03: 1.5 mg/kg/h via INTRADERMAL

## 2020-10-03 MED ORDER — ONDANSETRON HCL 4 MG PO TABS: 4.0000 mg | ORAL_TABLET | Freq: Four times a day (QID) | ORAL | Status: DC | PRN

## 2020-10-03 MED ORDER — EPHEDRINE SULFATE-NACL 50-0.9 MG/10ML-% IV SOSY
PREFILLED_SYRINGE | INTRAVENOUS | Status: DC | PRN
  Administered 2020-10-03: 10 mg via INTRAVENOUS
  Administered 2020-10-03: 5 mg via INTRAVENOUS

## 2020-10-03 MED ORDER — SIMETHICONE 80 MG PO CHEW: 80.0000 mg | CHEWABLE_TABLET | Freq: Four times a day (QID) | ORAL | Status: DC | PRN

## 2020-10-03 MED ORDER — DEXAMETHASONE SODIUM PHOSPHATE 10 MG/ML IJ SOLN
INTRAMUSCULAR | Status: AC
  Filled 2020-10-03: qty 1

## 2020-10-03 MED ORDER — ALBUMIN HUMAN 5 % IV SOLN
INTRAVENOUS | Status: AC
  Filled 2020-10-03: qty 250

## 2020-10-03 SURGICAL SUPPLY — 91 items
ADH SKN CLS APL DERMABOND .7 (GAUZE/BANDAGES/DRESSINGS) ×3
APL SKNCLS STERI-STRIP NONHPOA (GAUZE/BANDAGES/DRESSINGS) ×3
BAG DECANTER FOR FLEXI CONT (MISCELLANEOUS) ×4 IMPLANT
BENZOIN TINCTURE PRP APPL 2/3 (GAUZE/BANDAGES/DRESSINGS) ×4 IMPLANT
BLADE EXTENDED COATED 6.5IN (ELECTRODE) ×4 IMPLANT
CATH FOLEY 3WAY  5CC 16FR (CATHETERS) ×4
CATH FOLEY 3WAY 5CC 16FR (CATHETERS) ×3 IMPLANT
CATH ROBINSON RED A/P 16FR (CATHETERS) ×4 IMPLANT
COVER BACK TABLE 60X90IN (DRAPES) ×4 IMPLANT
COVER LIGHT HANDLE UNIVERSAL (MISCELLANEOUS) ×4 IMPLANT
COVER TIP SHEARS 8 DVNC (MISCELLANEOUS) ×3 IMPLANT
COVER TIP SHEARS 8MM DA VINCI (MISCELLANEOUS) ×4
DEFOGGER SCOPE WARMER CLEARIFY (MISCELLANEOUS) ×4 IMPLANT
DERMABOND ADVANCED (GAUZE/BANDAGES/DRESSINGS) ×1
DERMABOND ADVANCED .7 DNX12 (GAUZE/BANDAGES/DRESSINGS) ×3 IMPLANT
DILATOR CANAL MILEX (MISCELLANEOUS) ×4 IMPLANT
DRAPE ARM DVNC X/XI (DISPOSABLE) ×12 IMPLANT
DRAPE COLUMN DVNC XI (DISPOSABLE) ×3 IMPLANT
DRAPE DA VINCI XI ARM (DISPOSABLE) ×16
DRAPE DA VINCI XI COLUMN (DISPOSABLE) ×4
DRAPE UTILITY 15X26 TOWEL STRL (DRAPES) ×4 IMPLANT
DRAPE UTILITY XL STRL (DRAPES) ×8 IMPLANT
DRAPE WARM FLUID 44X44 (DRAPES) ×4 IMPLANT
DRSG OPSITE POSTOP 4X10 (GAUZE/BANDAGES/DRESSINGS) ×4 IMPLANT
DURAPREP 26ML APPLICATOR (WOUND CARE) ×8 IMPLANT
ELECT REM PT RETURN 9FT ADLT (ELECTROSURGICAL) ×8
ELECTRODE REM PT RTRN 9FT ADLT (ELECTROSURGICAL) ×6 IMPLANT
GAUZE 4X4 16PLY ~~LOC~~+RFID DBL (SPONGE) ×16 IMPLANT
GLOVE SURG ENC MOIS LTX SZ6.5 (GLOVE) ×4 IMPLANT
GLOVE SURG ENC TEXT LTX SZ6.5 (GLOVE) ×16 IMPLANT
GLOVE SURG POLYISO LF SZ6.5 (GLOVE) ×12 IMPLANT
GLOVE SURG UNDER POLY LF SZ6.5 (GLOVE) ×32 IMPLANT
GLOVE SURG UNDER POLY LF SZ7 (GLOVE) ×20 IMPLANT
GLOVE SURG UNDER POLY LF SZ7.5 (GLOVE) ×4 IMPLANT
GOWN STRL REUS W/TWL LRG LVL3 (GOWN DISPOSABLE) ×12 IMPLANT
HIBICLENS CHG 4% 4OZ BTL (MISCELLANEOUS) ×4 IMPLANT
HOLDER FOLEY CATH W/STRAP (MISCELLANEOUS) ×4 IMPLANT
IRRIG SUCT STRYKERFLOW 2 WTIP (MISCELLANEOUS) ×4
IRRIGATION SUCT STRKRFLW 2 WTP (MISCELLANEOUS) ×3 IMPLANT
IV NS 1000ML (IV SOLUTION) ×8
IV NS 1000ML BAXH (IV SOLUTION) ×6 IMPLANT
KIT TURNOVER CYSTO (KITS) ×4 IMPLANT
LEGGING LITHOTOMY PAIR STRL (DRAPES) ×4 IMPLANT
MANIFOLD NEPTUNE II (INSTRUMENTS) ×4 IMPLANT
NS IRRIG 1000ML POUR BTL (IV SOLUTION) ×16 IMPLANT
OBTURATOR OPTICAL STANDARD 8MM (TROCAR)
OBTURATOR OPTICAL STND 8 DVNC (TROCAR)
OBTURATOR OPTICALSTD 8 DVNC (TROCAR) IMPLANT
OCCLUDER COLPOPNEUMO (BALLOONS) ×4 IMPLANT
PACK ROBOT WH (CUSTOM PROCEDURE TRAY) ×4 IMPLANT
PACK ROBOTIC GOWN (GOWN DISPOSABLE) ×4 IMPLANT
PACK TRENDGUARD 450 HYBRID PRO (MISCELLANEOUS) ×3 IMPLANT
PAD OB MATERNITY 4.3X12.25 (PERSONAL CARE ITEMS) ×4 IMPLANT
PAD PREP 24X48 CUFFED NSTRL (MISCELLANEOUS) ×4 IMPLANT
POWDER SURGICEL 3.0 GRAM (HEMOSTASIS) ×4 IMPLANT
PROTECTOR NERVE ULNAR (MISCELLANEOUS) ×4 IMPLANT
SCISSORS LAP 5X45 EPIX DISP (ENDOMECHANICALS) ×4 IMPLANT
SEAL CANN UNIV 5-8 DVNC XI (MISCELLANEOUS) ×12 IMPLANT
SEAL XI 5MM-8MM UNIVERSAL (MISCELLANEOUS) ×16
SEALER VESSEL DA VINCI XI (MISCELLANEOUS)
SEALER VESSEL EXT DVNC XI (MISCELLANEOUS) IMPLANT
SET IRRIG Y TYPE TUR BLADDER L (SET/KITS/TRAYS/PACK) ×4 IMPLANT
SET TRI-LUMEN FLTR TB AIRSEAL (TUBING) ×4 IMPLANT
SLEEVE SURGEON STRL (DRAPES) ×4 IMPLANT
SPONGE T-LAP 18X18 ~~LOC~~+RFID (SPONGE) ×16 IMPLANT
SPONGE T-LAP 4X18 ~~LOC~~+RFID (SPONGE) ×4 IMPLANT
STRIP CLOSURE SKIN 1/2X4 (GAUZE/BANDAGES/DRESSINGS) ×12 IMPLANT
SUT PDS AB 0 CT1 27 (SUTURE) ×8 IMPLANT
SUT VIC AB 0 CT1 27 (SUTURE) ×20
SUT VIC AB 0 CT1 27XBRD ANBCTR (SUTURE) ×6 IMPLANT
SUT VIC AB 0 CT1 27XCR 8 STRN (SUTURE) ×9 IMPLANT
SUT VIC AB 0 CT1 36 (SUTURE) ×4 IMPLANT
SUT VIC AB 2-0 SH 27 (SUTURE) ×4
SUT VIC AB 2-0 SH 27XBRD (SUTURE) ×3 IMPLANT
SUT VIC AB 3-0 CT1 27 (SUTURE) ×8
SUT VIC AB 3-0 CT1 TAPERPNT 27 (SUTURE) ×6 IMPLANT
SUT VIC AB 4-0 KS 27 (SUTURE) ×4 IMPLANT
SUT VICRYL 0 TIES 12 18 (SUTURE) ×4 IMPLANT
SUT VICRYL RAPIDE 4/0 PS 2 (SUTURE) ×12 IMPLANT
SUT VLOC 180 0 9IN  GS21 (SUTURE) ×4
SUT VLOC 180 0 9IN GS21 (SUTURE) ×3 IMPLANT
SYR 10ML LL (SYRINGE) ×4 IMPLANT
TIP UTERINE 6.7X8CM BLUE DISP (MISCELLANEOUS) ×4 IMPLANT
TOWEL OR 17X26 10 PK STRL BLUE (TOWEL DISPOSABLE) ×8 IMPLANT
TRAY FOL W/BAG SLVR 16FR STRL (SET/KITS/TRAYS/PACK) ×3 IMPLANT
TRAY FOLEY W/BAG SLVR 16FR LF (SET/KITS/TRAYS/PACK) ×4
TRENDGUARD 450 HYBRID PRO PACK (MISCELLANEOUS) ×4
TROCAR PORT AIRSEAL 8X120 (TROCAR) ×4 IMPLANT
TUBE CONNECTING 12X1/4 (SUCTIONS) ×4 IMPLANT
WATER STERILE IRR 3000ML UROMA (IV SOLUTION) ×4 IMPLANT
WATER STERILE IRR 500ML POUR (IV SOLUTION) ×4 IMPLANT

## 2020-10-03 NOTE — Interval H&P Note (Signed)
History and Physical Interval Note:  10/03/2020 8:24 AM  Tracey Figueroa  has presented today for surgery, with the diagnosis of Abnormal Uterine Bleeding.  The various methods of treatment have been discussed with the patient and family. After consideration of risks, benefits and other options for treatment, the patient has consented to  Procedure(s): XI ROBOTIC ASSISTED LAPAROSCOPIC HYSTERECTOMY AND SALPINGO-OOPHORECTOMY. (Bilateral) as a surgical intervention.  The patient's history has been reviewed, patient examined, no change in status, stable for surgery.  I have reviewed the patient's chart and labs.  Questions were answered to the patient's satisfaction.     Steva Ready

## 2020-10-03 NOTE — Anesthesia Preprocedure Evaluation (Signed)
Anesthesia Evaluation  Patient identified by MRN, date of birth, ID band Patient awake    Reviewed: Allergy & Precautions, NPO status , Patient's Chart, lab work & pertinent test results  Airway Mallampati: II  TM Distance: >3 FB Neck ROM: Full    Dental  (+) Teeth Intact   Pulmonary asthma ,    Pulmonary exam normal        Cardiovascular negative cardio ROS   Rhythm:Regular Rate:Normal     Neuro/Psych Anxiety negative neurological ROS     GI/Hepatic negative GI ROS, Neg liver ROS,   Endo/Other  negative endocrine ROS  Renal/GU negative Renal ROS  Female GU complaint AUB    Musculoskeletal  (+) Arthritis , Osteoarthritis,    Abdominal (+)  Abdomen: soft. Bowel sounds: normal.  Peds  Hematology  (+) anemia ,   Anesthesia Other Findings   Reproductive/Obstetrics                             Anesthesia Physical Anesthesia Plan  ASA: 2  Anesthesia Plan: General   Post-op Pain Management:    Induction: Intravenous  PONV Risk Score and Plan: 3 and Ondansetron, Dexamethasone, Midazolam and Treatment may vary due to age or medical condition  Airway Management Planned: Mask and Oral ETT  Additional Equipment: None  Intra-op Plan:   Post-operative Plan: Extubation in OR  Informed Consent: I have reviewed the patients History and Physical, chart, labs and discussed the procedure including the risks, benefits and alternatives for the proposed anesthesia with the patient or authorized representative who has indicated his/her understanding and acceptance.     Dental advisory given  Plan Discussed with: CRNA  Anesthesia Plan Comments: (Lab Results      Component                Value               Date                      WBC                      7.4                 10/01/2020                HGB                      13.2                10/01/2020                HCT                       43.1                10/01/2020                MCV                      87.1                10/01/2020                PLT                      333  10/01/2020           Lab Results      Component                Value               Date                      NA                       139                 10/01/2020                K                        4.7                 10/01/2020                CO2                      25                  10/01/2020                GLUCOSE                  132 (H)             10/01/2020                BUN                      17                  10/01/2020                CREATININE               0.71                10/01/2020                CALCIUM                  9.2                 10/01/2020                GFRNONAA                 >60                 10/01/2020          )        Anesthesia Quick Evaluation

## 2020-10-03 NOTE — Transfer of Care (Signed)
Immediate Anesthesia Transfer of Care Note  Patient: Tracey Figueroa  Procedure(s) Performed: ATTEMPTED XI ROBOTIC ASSISTED LAPAROSCOPIC HYSTERECTOMY AND SALPINGO-OOPHORECTOMY. (Bilateral: Abdomen) CYSTOSCOPY (Urethra) HYSTERECTOMY ABDOMINAL WITH SALPINGO-OOPHORECTOMY (Bilateral: Abdomen) REPAIR OF VULVAR LACERATION (Vulva)  Patient Location: PACU  Anesthesia Type:General  Level of Consciousness: awake, alert , oriented and patient cooperative  Airway & Oxygen Therapy: Patient Spontanous Breathing and Patient connected to nasal cannula oxygen  Post-op Assessment: Report given to RN and Post -op Vital signs reviewed and stable  Post vital signs: Reviewed and stable  Last Vitals:  Vitals Value Taken Time  BP 95/59 10/03/20 1415  Temp    Pulse 73 10/03/20 1417  Resp 12 10/03/20 1417  SpO2 96 % 10/03/20 1417  Vitals shown include unvalidated device data.  Last Pain:  Vitals:   10/03/20 0722  TempSrc: Oral  PainSc: 0-No pain      Patients Stated Pain Goal: 7 (10/03/20 7915)  Complications: No notable events documented.

## 2020-10-03 NOTE — Anesthesia Procedure Notes (Signed)
Procedure Name: Intubation Date/Time: 10/03/2020 8:33 AM Performed by: Rogers Blocker, CRNA Pre-anesthesia Checklist: Patient identified, Emergency Drugs available, Suction available and Patient being monitored Patient Re-evaluated:Patient Re-evaluated prior to induction Oxygen Delivery Method: Circle System Utilized Preoxygenation: Pre-oxygenation with 100% oxygen Induction Type: IV induction Ventilation: Mask ventilation without difficulty Laryngoscope Size: Mac and 3 Grade View: Grade I Tube type: Oral Number of attempts: 1 Airway Equipment and Method: Stylet Placement Confirmation: ETT inserted through vocal cords under direct vision, positive ETCO2 and breath sounds checked- equal and bilateral Secured at: 22 cm Tube secured with: Tape Dental Injury: Teeth and Oropharynx as per pre-operative assessment

## 2020-10-03 NOTE — Op Note (Signed)
Pre Op Dx:   1. Abnormal uterine bleeding  Post Op Dx:   Same as pre-operative diagnosis  Procedure:   1. Attempted Robotic Assisted Total Laparoscopic Bilateral Salpingo-oophorectomy 2. Total Abdominal Hysterectomy with Bilateral Salpingo-oophorectomy 3. Cystoscopy 4. Vulvar laceration repair  Surgeon:  Dr. Steva Ready Assistants:  Dr. Gerald Leitz (assistant required due to the complexity of the anatomy) Anesthesia:  General  EBL:  600cc  IVF:  See anesthesia documentation  UOP: Approximately 300cc  Drains:  Foley catheter Specimen removed:  Uterus, cervix, bilateral fallopian tubes, and ovaries - all sent to pathology Device(s) implanted:  None Case Type:  Clean-contaminated Findings: Enlarged uterus with normal-appearing bilateral fallopian tubes and ovaries. Bilateral tubes and ovaries were adhesed to posterior surface of the uterus. Omental adhesions to anterior abdominal wall in the midline. Significant dense adhesions of posterior surface of the uterus to bowel and bilateral pelvic sidewall adhesions. Significant dense adhesions of bladder to the lower uterine segment. Tissue was very friable. On Cystoscopy, bilateral ureteral jets were visualized. Complications: Due to pelvic adhesive disease, unable to complete hysterectomy laparoscopically.  Indications: 55 y.o. with AUB which required blood transfusions who desired definitive surgical management.  Description of Procedure:   After informed consent the patient was taken to the operating room and placed in dorsal supine position where general endotracheal anesthesia was administered and found to be adequate.  She was placed in dorsal lithotomy position with her arms tucked.  She was prepped and draped in the usual sterile fashion. A pre-operative timeout was completed. A RUMI uterine manipulator with the Koh cup and a Foley catheter were placed. Due to position of the cervix, there was great difficulty placing the RUMI  manipulator.  A 21mm supraumbilical incision was made and a trochar was used to enter the abdomen under direct visualization.  Pneumoperitoneum was established and atraumatic entry confirmed. Due to omental adhesions visualized, a left 47mm port was placed under direct visualization. The omental adhesions were taken down using electrosurgery using scissors with bipolar energy. Once the adhesions were taken down, the remaining two additional 66mm ports were placed on either side of the umbilicus and an 92mm port was placed in the left upper quadrant under direct visualization. All port sites were injected with 10cc local anesthetic. The pelvis was bathed in a 60cc Ropivicaine solution.  The patient was placed in Trendelenburg position and the [employer] robotic device was docked.  Next, attention was turned to the console where the anatomy was visualized and the findings as above were noted. Attempts were made to dissect the adhesive tissue. Due to poor uterine manipulation with the RUMI manipulator and difficulty with visualization, decision was made to proceed with abdominal hysterectomy. The Da Vinci robotic device was undocked and all ports were visualized. The pneumoperitoneum was reduced. The skin was closed with 4-0 Vicryl in subcuticular fashion.  The abdomen was entered in layers through a pfannenstiel incision.  The uterus was identified and brought through the incision.  An Lenox Ahr retractor was placed. The posterior adhesions were taken down bluntly/manually. The right round ligament was doubly ligated, then divided with electrosurgery.  The anterior vesicouterine peritoneum was divided to mobilize the bladder but this was performed with great difficulty due to the dense adhesions between the bladder and the uterus.    Due to dense adhesions, there was an inability to create a window within the posterior leaflet of the broad ligament and therefore the uteroovarian anastamoses were divided  and suture ligated  bilaterally. The uterine artery and vein were skeletonized, divided and suture ligated bilaterally. The uterus was sharply dissected from the cervix and removed from the field.  Straight clamps were used to isolate and divide pedicles along the cervix in an intrafascial technique.  At the level of the external cervical os, the specimen was divided from the vagina and removed from the field. The vaginal cuff was closed using 3-0 Vicryl in a running fashion. The bilateral infundibulopelvic ligament was suture-ligated freeing the ovary and fallopian tube bilaterally. Hemostasis verified. An additional figure-of-eight suture was placed along the vaginal cuff for additional hemostasis. Irrigation was performed  and due to ooziness along the vaginal cuff, Surgicel hemostatic powder was placed along the cuff and adnexa. The powder was allowed to set and then thoroughly suction-irrigated. Sponge counts were correct.  The peritoneum was closed in a running fashion with 2-0 Vicryl.  Subfascial spaces were inspected and hemostasis assured.  The fascia was closed in a running fashion with 0-PDS.  The subcutaneous tissues were irrigated and hemostasis assured.  The subcutaneous tissues were closed with 3-0 Vicryl in a running fashion.  The skin was closed with 4-0 Vicryl.  A sterile bandage was applied. Skin glue was placed atop each port site.   Indigo carmine was administered. Cystoscopy was performed and demonstrated intact urothelium throughout the bladder, a normal-appearing trigone and bilaterally patent ureteral orifices with normal urine jets noted.  The cystoscope was removed and a fresh Foley catheter placed.   It was noted that there were two superficial left vulvar lacerations that were bleeding.  They were repaired in a running fashion using 3-0 Vicryl. Adequate hemostasis was noted. No lacerations or foreign objects within the vagina were found. The patient was transferred to PACU.  All  needle, sponge, and instrument counts were correct at the end of the case.  Disposition:  PACU  Steva Ready, DO

## 2020-10-03 NOTE — Progress Notes (Signed)
GYN Post-Operative Note  Subjective: Patient feeling well. Her support person/friend, Marcelino Duster, is at bedside with her. Pain is well-controlled. Has tolerated some sips of liquids. Denies fevers, chills, chest pain, SOB, or N/V.  Objective: BP 133/69 (BP Location: Right Arm)   Pulse 78   Temp 97.8 F (36.6 C) (Oral)   Resp 17   Ht 5\' 2"  (1.575 m)   Wt 116.6 kg   LMP 09/19/2020 (Approximate)   SpO2 97%   BMI 47.01 kg/m  Gen:  NAD, pleasant, resting in bed Abd: Soft, non-distended, appropriately tender to palpation throughout, laparoscopic port sites dressed with skin glue and are c/d/I, honeycomb dressing in place and is c/d/I Ext: No bilateral calf tenderness, SCDs on (no machine at bedisde)  Foley: ~250-300cc blue urine (due to indigo carmine administration)  A/P: POD#0 Attempted Robotic Assisted TLH/BSO converted to Total Abdominal Hysterectomy/Bilateral Salpingo-oophorectomy, Cystoscopy, and repair of vulvar lacerations.  - Doing well post-operatively - Pulm:  RA - GI:  DAT  Prophylaxis:  Protonix 40mg  IV qHS - Renal:  Foley in place, remove foley in AM - IVF:  LR at 125cc/hour - Pain:  Toradol-->Motrin, Tylenol PRN, Morphine, and Oxycodone - DVT Prophylaxis:  SCDs (order placed and discussed with NA obtaining vital signs) - Activity:  Ad lib - Labs:  CBC ordered for AM  Reviewed course of events and surgery with patient in full detail. All questions invited and answered.  09/21/2020, DO

## 2020-10-03 NOTE — Brief Op Note (Signed)
10/03/2020  1:45 PM  PATIENT:  Tracey Figueroa  55 y.o. female  PRE-OPERATIVE DIAGNOSIS:  Abnormal Uterine Bleeding  POST-OPERATIVE DIAGNOSIS:  Abnormal Uterine Bleeding  PROCEDURE:  Procedure(s): ATTEMPTED XI ROBOTIC ASSISTED LAPAROSCOPIC HYSTERECTOMY AND SALPINGO-OOPHORECTOMY. (Bilateral) CYSTOSCOPY HYSTERECTOMY ABDOMINAL WITH SALPINGO-OOPHORECTOMY (Bilateral)  SURGEON:  Surgeon(s) and Role:    Connye Burkitt, Efraim Kaufmann, DO - Primary    * Gerald Leitz, MD - Assisting  PHYSICIAN ASSISTANT: N/A  ASSISTANTS: Dr. Gerald Leitz   ANESTHESIA:   general  EBL:  600 mL   BLOOD ADMINISTERED:none  DRAINS: Urinary Catheter (Foley)   LOCAL MEDICATIONS USED:  OTHER Ropivacaine  SPECIMEN:  Source of Specimen:  Uterus, cervix, bilateral fallopian tubes, bilateral ovaries  DISPOSITION OF SPECIMEN:  PATHOLOGY  COUNTS:  YES  TOURNIQUET:  * No tourniquets in log *  DICTATION: .Note written in EPIC  PLAN OF CARE:  Inpatient  PATIENT DISPOSITION:  PACU - hemodynamically stable.   Delay start of Pharmacological VTE agent (>24hrs) due to surgical blood loss or risk of bleeding: yes

## 2020-10-04 ENCOUNTER — Encounter (HOSPITAL_BASED_OUTPATIENT_CLINIC_OR_DEPARTMENT_OTHER): Payer: Self-pay | Admitting: Obstetrics and Gynecology

## 2020-10-04 LAB — CBC
HCT: 30.8 % — ABNORMAL LOW (ref 36.0–46.0)
Hemoglobin: 9.7 g/dL — ABNORMAL LOW (ref 12.0–15.0)
MCH: 27.3 pg (ref 26.0–34.0)
MCHC: 31.5 g/dL (ref 30.0–36.0)
MCV: 86.8 fL (ref 80.0–100.0)
Platelets: 313 10*3/uL (ref 150–400)
RBC: 3.55 MIL/uL — ABNORMAL LOW (ref 3.87–5.11)
RDW: 15.9 % — ABNORMAL HIGH (ref 11.5–15.5)
WBC: 13.1 10*3/uL — ABNORMAL HIGH (ref 4.0–10.5)
nRBC: 0 % (ref 0.0–0.2)

## 2020-10-04 LAB — SURGICAL PATHOLOGY

## 2020-10-04 NOTE — Progress Notes (Signed)
1 Day Post-Op Procedure(s) (LRB): ATTEMPTED XI ROBOTIC ASSISTED LAPAROSCOPIC HYSTERECTOMY AND SALPINGO-OOPHORECTOMY. (Bilateral) CYSTOSCOPY HYSTERECTOMY ABDOMINAL WITH SALPINGO-OOPHORECTOMY (Bilateral) REPAIR OF VULVAR LACERATION  Subjective: Patient reports incisional pain and tolerating PO.   No void yet foley just out 2 hours ago. She ambulated to the bathroom this morning. No nausea. Tolerating po . Pain 4 out of 10. Reports numbness in there left arm..   Objective: I have reviewed patient's vital signs, intake and output, and labs.  General: alert, cooperative, and no distress GI: Abdomen is soft nondistended appropriately tender no rebound no guarding  Extremities: extremities normal, atraumatic, no cyanosis or edema Incisions Clean dry and intact.   Assessment: s/p Procedure(s): ATTEMPTED XI ROBOTIC ASSISTED LAPAROSCOPIC HYSTERECTOMY AND SALPINGO-OOPHORECTOMY. (Bilateral) CYSTOSCOPY HYSTERECTOMY ABDOMINAL WITH SALPINGO-OOPHORECTOMY (Bilateral) REPAIR OF VULVAR LACERATION: stable  Plan: Advance diet Encourage ambulation Discontinue IV fluids Awaiting return of bowel and bladder function.  Probable discharge home tomorrow   LOS: 1 day    Gerald Leitz 10/04/2020, 6:08 PM

## 2020-10-04 NOTE — Anesthesia Postprocedure Evaluation (Signed)
Anesthesia Post Note  Patient: Tracey Figueroa  Procedure(s) Performed: ATTEMPTED XI ROBOTIC ASSISTED LAPAROSCOPIC HYSTERECTOMY AND SALPINGO-OOPHORECTOMY. (Bilateral: Abdomen) CYSTOSCOPY (Urethra) HYSTERECTOMY ABDOMINAL WITH SALPINGO-OOPHORECTOMY (Bilateral: Abdomen) REPAIR OF VULVAR LACERATION (Vulva)     Patient location during evaluation: PACU Anesthesia Type: General Level of consciousness: awake and alert Pain management: pain level controlled Vital Signs Assessment: post-procedure vital signs reviewed and stable Respiratory status: spontaneous breathing, nonlabored ventilation, respiratory function stable and patient connected to nasal cannula oxygen Cardiovascular status: blood pressure returned to baseline and stable Postop Assessment: no apparent nausea or vomiting Anesthetic complications: no   No notable events documented.  Last Vitals:  Vitals:   10/04/20 0523 10/04/20 0524  BP:  (!) 122/58  Pulse: 73 80  Resp: 16   Temp: 37.1 C   SpO2: 99% 98%    Last Pain:  Vitals:   10/04/20 0523  TempSrc: Oral  PainSc:                  [name] 

## 2020-10-05 MED ORDER — IBUPROFEN 600 MG PO TABS: 600.0000 mg | ORAL_TABLET | Freq: Four times a day (QID) | ORAL | 3 refills | Status: DC | PRN

## 2020-10-05 MED ORDER — DOCUSATE SODIUM 100 MG PO CAPS: 100.0000 mg | ORAL_CAPSULE | Freq: Two times a day (BID) | ORAL | 3 refills | Status: DC

## 2020-10-05 MED ORDER — OXYCODONE HCL 5 MG PO TABS: 5.0000 mg | ORAL_TABLET | Freq: Four times a day (QID) | ORAL | 0 refills | Status: AC | PRN

## 2020-10-05 NOTE — Discharge Summary (Signed)
Physician Discharge Summary  Patient ID: Tracey Figueroa MRN: [id number] DOB/AGE: January 23, 1965 55 y.o.  Admit date: 10/03/2020 Discharge date: 10/05/2020  Admission Diagnoses: Abnormal uterine bleeding  Discharge Diagnoses:  Active Problems:   Abnormal uterine bleeding (AUB)  Procedure(s): 1. Attempted Robotic Assisted Total Laparoscopic Bilateral Salpingo-oophorectomy 2. Total Abdominal Hysterectomy with Bilateral Salpingo-oophorectomy 3. Cystoscopy 4. Vulvar laceration repair  Discharged Condition: good  Hospital Course: Patient was admitted on 10/03/2020 for the above named procedure(s) for the above named diagnoses. Prior to hospital discharge, patient was tolerating PO, ambulating, voiding spontaneously, passing flatus, and pain was well-controlled. See hospital chart for specific details. Patient was discharged home in stable condition.  Consults: None  Significant Diagnostic Studies: labs:  CBC Latest Ref Rng & Units 10/04/2020 10/01/2020 06/06/2020  WBC 4.0 - 10.5 K/uL 13.1(H) 7.4 11.4(H)  Hemoglobin 12.0 - 15.0 g/dL 0.8(M) 57.8 46.9  Hematocrit 36.0 - 46.0 % 30.8(L) 43.1 42.1  Platelets 150 - 400 K/uL 313 333 418(H)    Treatments: See above  Discharge Exam: Blood pressure 128/61, pulse 94, temperature 98.6 F (37 C), temperature source Oral, resp. rate 18, height 5\' 2"  (1.575 m), weight 116.6 kg, last menstrual period 09/19/2020, SpO2 98 %. Gen:  NAD, pleasant and cooperative Cardio:  RRR Pulm:  CTAB, no wheezes/rales/rhonchi Abd:  Soft, non-distended, non-tender throughout, no rebound/guarding Ext:  No bilateral LE edema, no bilateral calf tenderness, SCDs on an workingd  Disposition: Discharge disposition: 01-Home or Self Care       Discharge Instructions     Discharge patient   Complete by: As directed    Discharge disposition: 01-Home or Self Care   Discharge patient date: 10/05/2020      Allergies as of 10/05/2020       Reactions   Erythromycin     Other reaction(s): stomach upset   Prednisone Other (See Comments)   "MAKES ME GRUMP AND HUNGRY"        Medication List     STOP taking these medications    ferrous sulfate 325 (65 FE) MG EC tablet   Fluticasone-Salmeterol 232-14 MCG/ACT Aepb       TAKE these medications    diclofenac Sodium 1 % Gel Commonly known as: VOLTAREN as needed.   docusate sodium 100 MG capsule Commonly known as: Colace Take 1 capsule (100 mg total) by mouth 2 (two) times daily. What changed: when to take this   ibuprofen 600 MG tablet Commonly known as: ADVIL Take 1 tablet (600 mg total) by mouth every 6 (six) hours as needed for mild pain, moderate pain or cramping. What changed:  medication strength how much to take when to take this reasons to take this   Juice Plus Fibre Liqd 2 (two) times daily. 1 VEGETABLE AND 1 FRUIT ONE BID   oxyCODONE 5 MG immediate release tablet Commonly known as: Oxy IR/ROXICODONE Take 1-2 tablets (5-10 mg total) by mouth every 6 (six) hours as needed for up to 7 days for moderate pain, severe pain or breakthrough pain.   sertraline 100 MG tablet Commonly known as: ZOLOFT Take 100 mg by mouth daily.   tranexamic acid 650 MG Tabs tablet Commonly known as: LYSTEDA Take 1,300 mg by mouth 3 (three) times daily as needed.   UNABLE TO FIND DoTerra ESSENTIAL OIL AFTER MEALS PRN        Follow-up Information     10/07/2020, DO Follow up in 2 week(s).   Specialty: Obstetrics and Gynecology Why: We will arrange  a 2 week post-operative visit with you and please keep your previously 6 week post-operative visit. Contact information: [address] [place] [address] [phone]                 Signed: Steva Ready 10/05/2020, 7:26 AM

## 2020-10-05 NOTE — Progress Notes (Signed)
Discharge instructions discussed with patient and family, verbalized agreement and understanding 

## 2020-10-07 LAB — TYPE AND SCREEN
ABO/RH(D): O POS
Antibody Screen: POSITIVE
Donor AG Type: NEGATIVE
Donor AG Type: NEGATIVE
Unit division: 0
Unit division: 0

## 2020-10-07 LAB — BPAM RBC
Blood Product Expiration Date: 202210172359
Blood Product Expiration Date: 202210172359
Unit Type and Rh: 5100
Unit Type and Rh: 5100

## 2020-10-16 DIAGNOSIS — Z4889 Encounter for other specified surgical aftercare: Secondary | ICD-10-CM | POA: Diagnosis not present

## 2020-10-20 HISTORY — PX: ROOT CANAL: SHX2363

## 2020-11-08 ENCOUNTER — Other Ambulatory Visit: Payer: Self-pay | Admitting: Family Medicine

## 2020-11-08 DIAGNOSIS — Z1231 Encounter for screening mammogram for malignant neoplasm of breast: Secondary | ICD-10-CM

## 2020-12-10 ENCOUNTER — Ambulatory Visit
Admission: RE | Admit: 2020-12-10 | Discharge: 2020-12-10 | Disposition: A | Discharge status: Home / Self Care | Payer: BC Managed Care – PPO | Source: Ambulatory Visit | Attending: Family Medicine | Admitting: Family Medicine

## 2020-12-10 DIAGNOSIS — Z1231 Encounter for screening mammogram for malignant neoplasm of breast: Secondary | ICD-10-CM

## 2020-12-21 DIAGNOSIS — E7800 Pure hypercholesterolemia, unspecified: Secondary | ICD-10-CM | POA: Diagnosis not present

## 2020-12-21 DIAGNOSIS — E1169 Type 2 diabetes mellitus with other specified complication: Secondary | ICD-10-CM | POA: Diagnosis not present

## 2020-12-21 DIAGNOSIS — Z23 Encounter for immunization: Secondary | ICD-10-CM | POA: Diagnosis not present

## 2020-12-21 DIAGNOSIS — J309 Allergic rhinitis, unspecified: Secondary | ICD-10-CM | POA: Diagnosis not present

## 2020-12-21 DIAGNOSIS — F39 Unspecified mood [affective] disorder: Secondary | ICD-10-CM | POA: Diagnosis not present

## 2020-12-21 DIAGNOSIS — Z0000 Encounter for general adult medical examination without abnormal findings: Secondary | ICD-10-CM | POA: Diagnosis not present

## 2021-01-07 ENCOUNTER — Other Ambulatory Visit: Payer: Self-pay

## 2021-01-07 ENCOUNTER — Ambulatory Visit (INDEPENDENT_AMBULATORY_CARE_PROVIDER_SITE_OTHER): Payer: BC Managed Care – PPO | Admitting: Allergy

## 2021-01-07 ENCOUNTER — Encounter: Payer: Self-pay | Admitting: Allergy

## 2021-01-07 VITALS — BP 142/72 | HR 95 | Temp 97.8°F | Ht 61.5 in | Wt 254.8 lb

## 2021-01-07 DIAGNOSIS — J4540 Moderate persistent asthma, uncomplicated: Secondary | ICD-10-CM

## 2021-01-07 DIAGNOSIS — J301 Allergic rhinitis due to pollen: Secondary | ICD-10-CM | POA: Insufficient documentation

## 2021-01-07 MED ORDER — FLUTICASONE-SALMETEROL 113-14 MCG/ACT IN AEPB: 1.0000 | INHALATION_SPRAY | Freq: Two times a day (BID) | RESPIRATORY_TRACT | 5 refills | Status: DC

## 2021-01-07 MED ORDER — ALBUTEROL SULFATE HFA 108 (90 BASE) MCG/ACT IN AERS: 2.0000 | INHALATION_SPRAY | RESPIRATORY_TRACT | 1 refills | Status: DC | PRN

## 2021-01-07 NOTE — Assessment & Plan Note (Signed)
Past history - Rhinoconjunctivitis symptoms for the last 20+ years mainly in the spring and fall.  Takes Claritin as needed with good benefit. 2022 skin testing showed: Borderline positive to johnson grass only. Interim history - asymptomatic.  Continue environmental control measures.  Use over the counter antihistamines such as Zyrtec (cetirizine), Claritin (loratadine), Allegra (fexofenadine), or Xyzal (levocetirizine) daily as needed. May take twice a day during allergy flares. May switch antihistamines every few months.  Nasal saline spray (i.e., Simply Saline) is recommended as needed and prior to medicated nasal sprays.

## 2021-01-07 NOTE — Patient Instructions (Addendum)
Asthma: Daily controller medication(s): DECREASE Airduo 1 puff once a day and rinse mouth after each use.  If you notice worsening symptoms or if it's too expensive then okay to go back to Airduo 1 puff once a day.  During upper respiratory infections/asthma flares:  Start Airduo 1 puff twice a day for 1-2 weeks until your breathing symptoms return to baseline.  Pretreat with albuterol 2 puffs. May use albuterol rescue inhaler 2 puffs every 4 to 6 hours as needed for shortness of breath, chest tightness, coughing, and wheezing. May use albuterol rescue inhaler 2 puffs 5 to 15 minutes prior to strenuous physical activities. Monitor frequency of use.  Asthma control goals:  Full participation in all desired activities (may need albuterol before activity) Albuterol use two times or less a week on average (not counting use with activity) Cough interfering with sleep two times or less a month Oral steroids no more than once a year No hospitalizations   Environmental allergies 2022 skin testing showed positive to grass pollen.  Continue environmental control measures as below. Use over the counter antihistamines such as Zyrtec (cetirizine), Claritin (loratadine), Allegra (fexofenadine), or Xyzal (levocetirizine) daily as needed. May take twice a day during allergy flares. May switch antihistamines every few months. Nasal saline spray (i.e., Simply Saline) is recommended as needed and prior to medicated nasal sprays.  Follow up in 6 months or sooner if needed.   Reducing Pollen Exposure Pollen seasons: trees (spring), grass (summer) and ragweed/weeds (fall). Keep windows closed in your home and car to lower pollen exposure.  Install air conditioning in the bedroom and throughout the house if possible.  Avoid going out in dry windy days - especially early morning. Pollen counts are highest between 5 - 10 AM and on dry, hot and windy days.  Save outside activities for late  afternoon or after a heavy rain, when pollen levels are lower.  Avoid mowing of grass if you have grass pollen allergy. Be aware that pollen can also be transported indoors on people and pets.  Dry your clothes in an automatic dryer rather than hanging them outside where they might collect pollen.  Rinse hair and eyes before bedtime.

## 2021-01-07 NOTE — Progress Notes (Signed)
Follow Up Note  RE: Tracey Figueroa MRN: [id number] DOB: [birth year] Date of Office Visit: 01/07/2021  Referring provider: Maurice Small, MD Primary care provider: Maurice Small, MD  Chief Complaint: Asthma (Was cleaning with scrubbing bubbles and had to use rescue inhaler but that only happened one time/ACT-24)  History of Present Illness: I had the pleasure of seeing Tracey Figueroa for a follow up visit at the [facility] on 01/07/2021. She is a 55 y.o. female, who is being followed for asthma, allergic rhinitis. Her previous allergy office visit was on 09/03/2020 with Dr. Selena Batten. Today is a regular follow up visit.  Moderate persistent asthma Used albuterol once after exposure to scrubbing bubbles with good benefit. Currently using Airduo 1 puff once a day with good benefit. No worsening symptoms when forgets to take a dose.   Denies any ER/urgent care visits or prednisone use since the last visit. Requesting less frequent follow ups due to co-pay.    Allergic rhinitis No issues currently and usually takes Claritin daily during the pollen seasons.  Not needing any nasal sprays or eye drops.  Assessment and Plan: Tracey Figueroa is a 56 y.o. female with: Moderate persistent asthma without complication Past history - Diagnosed with asthma over 5+ years ago.  Currently on Advair discus 100 mcg 1 puff twice a day and using albuterol less than once a week with good benefit.  Had COVID-19 in January 2022. Severe anemia requiring hospitalization. 2022 spirometry showed some restriction with 7% improvement in FEV1 post bronchodilator treatment.  Clinically feeling improved. Interim history - only used albuterol once since last OV. No flare in symptoms if misses a dose.  Today's spirometry was normal.  Daily controller medication(s): DECREASE to Airduo 1 puff once a day and rinse mouth after each use.  If you notice worsening symptoms or if it's too expensive  then okay to go back to Airduo 1 puff once a day.  During upper respiratory infections/asthma flares:  Start Airduo 1 puff twice a day for 1-2 weeks until your breathing symptoms return to baseline.  Pretreat with albuterol 2 puffs. May use albuterol rescue inhaler 2 puffs every 4 to 6 hours as needed for shortness of breath, chest tightness, coughing, and wheezing. May use albuterol rescue inhaler 2 puffs 5 to 15 minutes prior to strenuous physical activities. Monitor frequency of use.  Get spirometry at next visit.  Seasonal allergic rhinitis due to pollen Past history - Rhinoconjunctivitis symptoms for the last 20+ years mainly in the spring and fall.  Takes Claritin as needed with good benefit. 2022 skin testing showed: Borderline positive to johnson grass only. Interim history - asymptomatic. Continue environmental control measures. Use over the counter antihistamines such as Zyrtec (cetirizine), Claritin (loratadine), Allegra (fexofenadine), or Xyzal (levocetirizine) daily as needed. May take twice a day during allergy flares. May switch antihistamines every few months. Nasal saline spray (i.e., Simply Saline) is recommended as needed and prior to medicated nasal sprays.  Return in about 6 months (around 07/08/2021).  Meds ordered this encounter  Medications   Fluticasone-Salmeterol (AIRDUO RESPICLICK 113/14) 113-14 MCG/ACT AEPB    Sig: Inhale 1 puff into the lungs in the morning and at bedtime. Rinse mouth after each use.    Dispense:  1 each    Refill:  5   albuterol (VENTOLIN HFA) 108 (90 Base) MCG/ACT inhaler    Sig: Inhale 2 puffs into the lungs every 4 (four) hours as needed  for wheezing or shortness of breath (coughing fits).    Dispense:  18 g    Refill:  1   Lab Orders  No laboratory test(s) ordered today    Diagnostics: Spirometry:  Tracings reviewed. Her effort: Good reproducible efforts. FVC: 2.59L FEV1: 2.03L, 94% predicted FEV1/FVC ratio:  78% Interpretation: Spirometry consistent with normal pattern.  Please see scanned spirometry results for details.  Medication List:  Current Outpatient Medications  Medication Sig Dispense Refill   albuterol (VENTOLIN HFA) 108 (90 Base) MCG/ACT inhaler Inhale 2 puffs into the lungs every 4 (four) hours as needed for wheezing or shortness of breath (coughing fits). 18 g 1   diclofenac Sodium (VOLTAREN) 1 % GEL as needed.     Fluticasone-Salmeterol (AIRDUO RESPICLICK 113/14) 113-14 MCG/ACT AEPB Inhale 1 puff into the lungs in the morning and at bedtime. Rinse mouth after each use. 1 each 5   ibuprofen (ADVIL) 600 MG tablet Take 1 tablet (600 mg total) by mouth every 6 (six) hours as needed for mild pain, moderate pain or cramping. 30 tablet 3   Nutritional Supplements (JUICE PLUS FIBRE) LIQD 2 (two) times daily. 1 VEGETABLE AND 1 FRUIT ONE BID     sertraline (ZOLOFT) 100 MG tablet Take 100 mg by mouth daily.     UNABLE TO FIND DoTerra ESSENTIAL OIL AFTER MEALS PRN     tranexamic acid (LYSTEDA) 650 MG TABS tablet Take 1,300 mg by mouth 3 (three) times daily as needed.     No current facility-administered medications for this visit.   Allergies: Allergies  Allergen Reactions   Erythromycin     Other reaction(s): stomach upset   Prednisone Other (See Comments)    "MAKES ME GRUMP AND HUNGRY"   I reviewed her past medical history, social history, family history, and environmental history and no significant changes have been reported from her previous visit.  Review of Systems  Constitutional:  Negative for appetite change, chills, fever and unexpected weight change.  HENT:  Negative for congestion, rhinorrhea and sneezing.   Eyes:  Negative for itching.  Respiratory:  Negative for cough, chest tightness, shortness of breath and wheezing.   Cardiovascular:  Negative for chest pain.  Gastrointestinal:  Negative for abdominal pain.  Genitourinary:  Negative for difficulty urinating.  Skin:   Negative for rash.  Allergic/Immunologic: Positive for environmental allergies.   Objective: BP (!) 142/72    Pulse 95    Temp 97.8 F (36.6 C) (Temporal)    Ht 5' 1.5" (1.562 m)    Wt 254 lb 12.8 oz (115.6 kg)    SpO2 98%    BMI 47.36 kg/m  Body mass index is 47.36 kg/m. Physical Exam Vitals and nursing note reviewed.  Constitutional:      Appearance: Normal appearance. She is well-developed.  HENT:     Head: Normocephalic and atraumatic.     Right Ear: Tympanic membrane and external ear normal.     Left Ear: Tympanic membrane and external ear normal.     Nose: Nose normal.     Mouth/Throat:     Mouth: Mucous membranes are moist.     Pharynx: Oropharynx is clear.  Eyes:     Conjunctiva/sclera: Conjunctivae normal.  Cardiovascular:     Rate and Rhythm: Normal rate and regular rhythm.     Heart sounds: Normal heart sounds. No murmur heard.   No friction rub. No gallop.  Pulmonary:     Effort: Pulmonary effort is normal.  Breath sounds: Normal breath sounds. No wheezing, rhonchi or rales.  Musculoskeletal:     Cervical back: Neck supple.  Skin:    General: Skin is warm.     Findings: No rash.  Neurological:     Mental Status: She is alert and oriented to person, place, and time.  Psychiatric:        Behavior: Behavior normal.  Previous notes and tests were reviewed. The plan was reviewed with the patient/family, and all questions/concerned were addressed.  It was my pleasure to see Tracey Figueroa today and participate in her care. Please feel free to contact me with any questions or concerns.  Sincerely,  Wyline Mood, DO Allergy & Immunology  [facility] office: [phone] [facility] office: [phone]

## 2021-01-07 NOTE — Assessment & Plan Note (Signed)
Past history - Diagnosed with asthma over 5+ years ago.  Currently on Advair discus 100 mcg 1 puff twice a day and using albuterol less than once a week with good benefit.  Had COVID-19 in January 2022. Severe anemia requiring hospitalization. 2022 spirometry showed some restriction with 7% improvement in FEV1 post bronchodilator treatment.  Clinically feeling improved. Interim history - only used albuterol once since last OV. No flare in symptoms if misses a dose.   Today's spirometry was normal.   Daily controller medication(s): DECREASE to Airduo 1 puff once a day and rinse mouth after each use.  o If you notice worsening symptoms or if it's too expensive then okay to go back to Airduo 1 puff once a day.   During upper respiratory infections/asthma flares:  o Start Airduo 1 puff twice a day for 1-2 weeks until your breathing symptoms return to baseline.  o Pretreat with albuterol 2 puffs.  May use albuterol rescue inhaler 2 puffs every 4 to 6 hours as needed for shortness of breath, chest tightness, coughing, and wheezing. May use albuterol rescue inhaler 2 puffs 5 to 15 minutes prior to strenuous physical activities. Monitor frequency of use.   Get spirometry at next visit.

## 2021-02-08 ENCOUNTER — Other Ambulatory Visit: Payer: Self-pay | Admitting: Allergy

## 2021-07-08 ENCOUNTER — Ambulatory Visit: Payer: BC Managed Care – PPO | Admitting: Allergy

## 2021-08-15 ENCOUNTER — Other Ambulatory Visit: Payer: Self-pay | Admitting: Allergy

## 2021-08-19 ENCOUNTER — Ambulatory Visit: Payer: BC Managed Care – PPO | Admitting: Allergy

## 2021-08-25 DIAGNOSIS — H60312 Diffuse otitis externa, left ear: Secondary | ICD-10-CM | POA: Diagnosis not present

## 2021-09-18 ENCOUNTER — Other Ambulatory Visit: Payer: Self-pay | Admitting: Allergy

## 2021-09-19 ENCOUNTER — Other Ambulatory Visit: Payer: Self-pay | Admitting: *Deleted

## 2021-09-23 DIAGNOSIS — U071 COVID-19: Secondary | ICD-10-CM | POA: Diagnosis not present

## 2021-09-23 DIAGNOSIS — R5383 Other fatigue: Secondary | ICD-10-CM | POA: Diagnosis not present

## 2021-09-23 DIAGNOSIS — R051 Acute cough: Secondary | ICD-10-CM | POA: Diagnosis not present

## 2021-09-23 DIAGNOSIS — R509 Fever, unspecified: Secondary | ICD-10-CM | POA: Diagnosis not present

## 2021-10-02 IMAGING — MG DIGITAL DIAGNOSTIC BILAT W/ TOMO W/ CAD
8 series · 8 of 24 positions shown · non-contrast
Comparison: Previous exam(s).

ACR Breast Density Category a: The breast tissue is almost entirely
fatty.

CLINICAL DATA: 53-year-old female presenting for evaluation of
intermittent itching of the right lateral areola for 2 months. The
patient feels that once she may have noticed some crusting on her
nipple, but otherwise has not noticed any rashes or discharge.

EXAM:
DIGITAL DIAGNOSTIC BILATERAL MAMMOGRAM WITH CAD AND TOMO
RIGHT BREAST ULTRASOUND

[L MLO synth-2D]
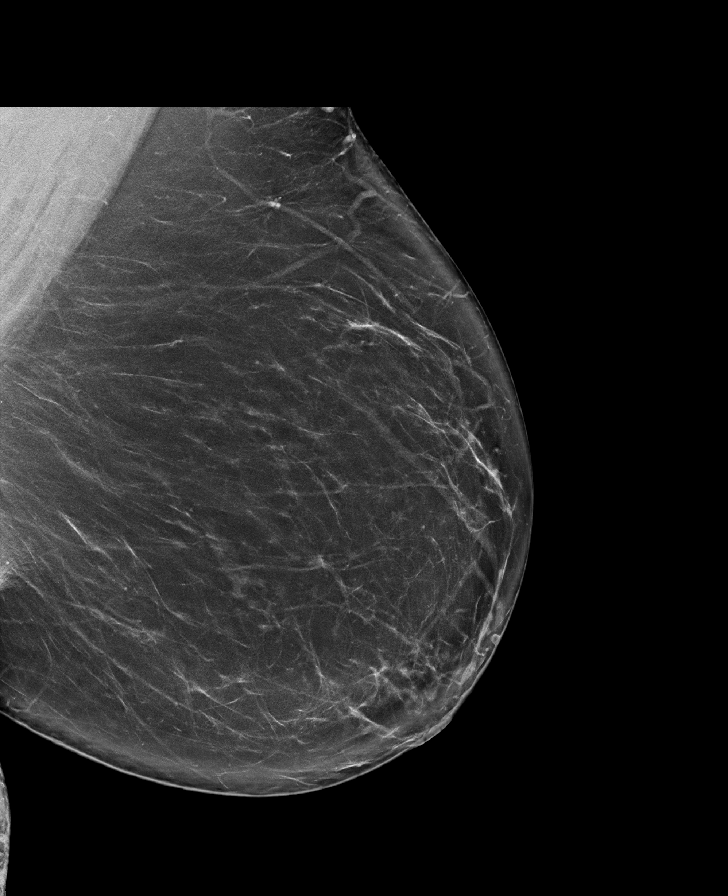

[R CC synth-2D]
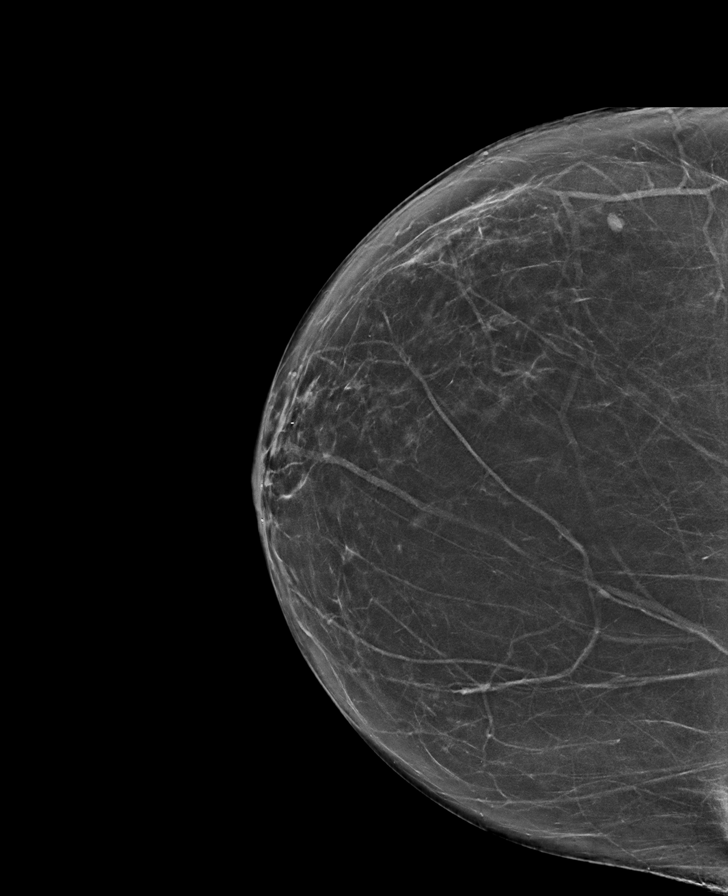

[R MLO synth-2D]
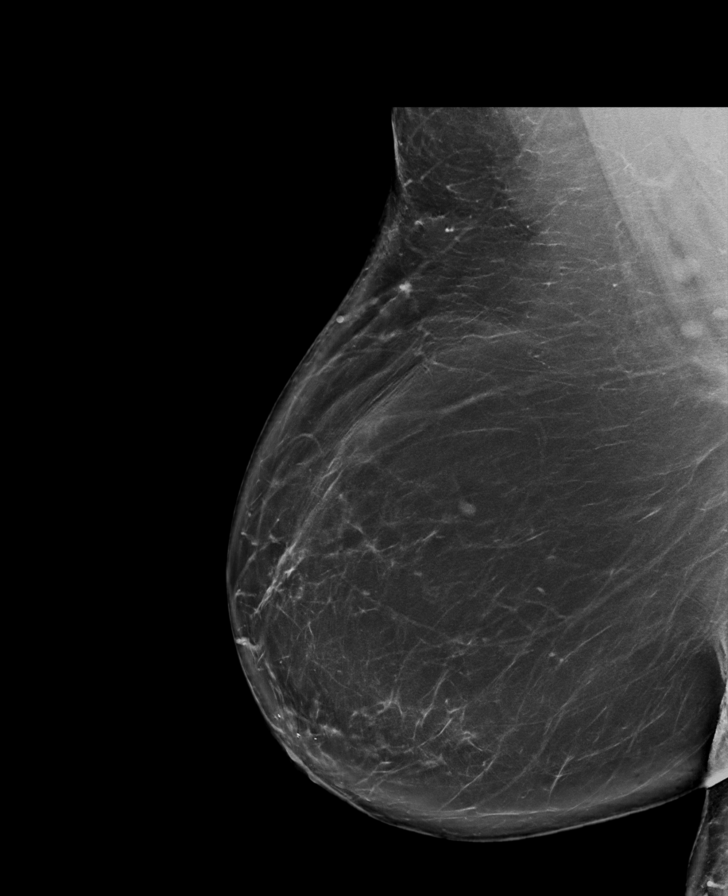

[L CC synth-2D]
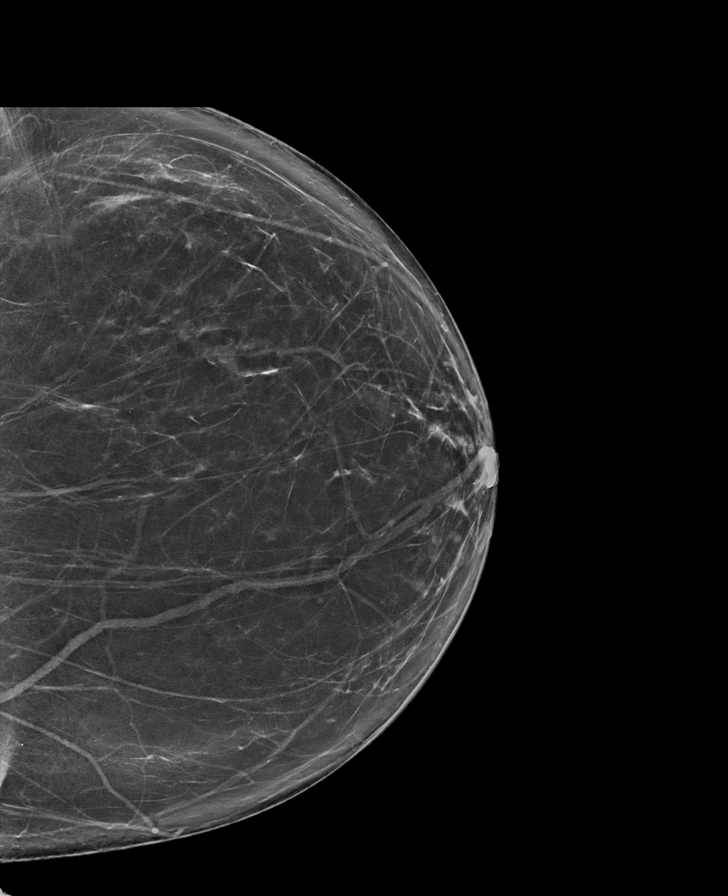

[L MLO tomo · tomo slice 49/97.0]
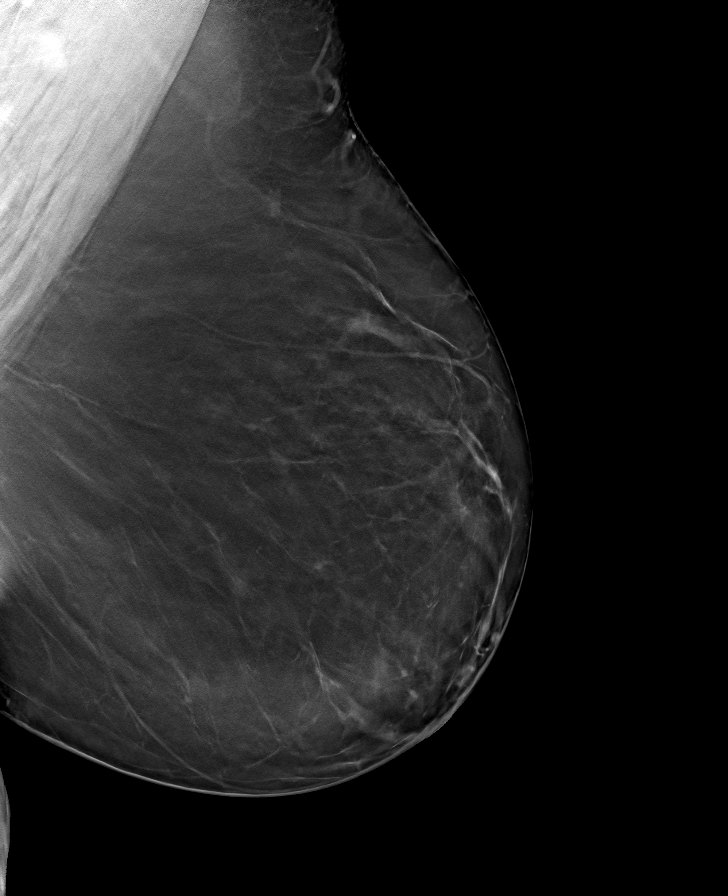

[L CC tomo · tomo slice 42/83.0]
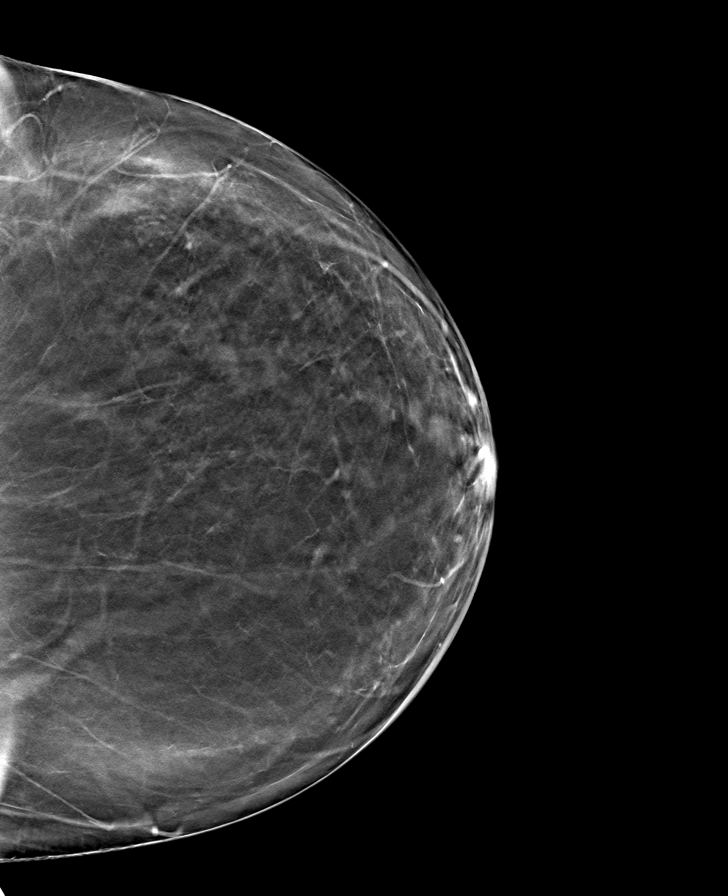

[R MLO tomo · tomo slice 53/106.0]
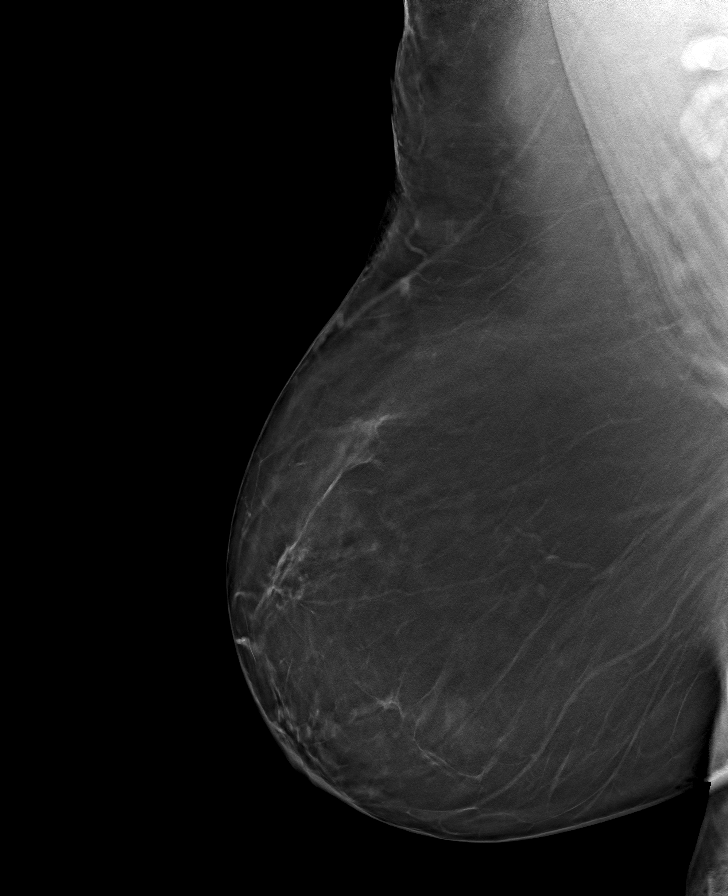

[R CC tomo · tomo slice 39/76.0]
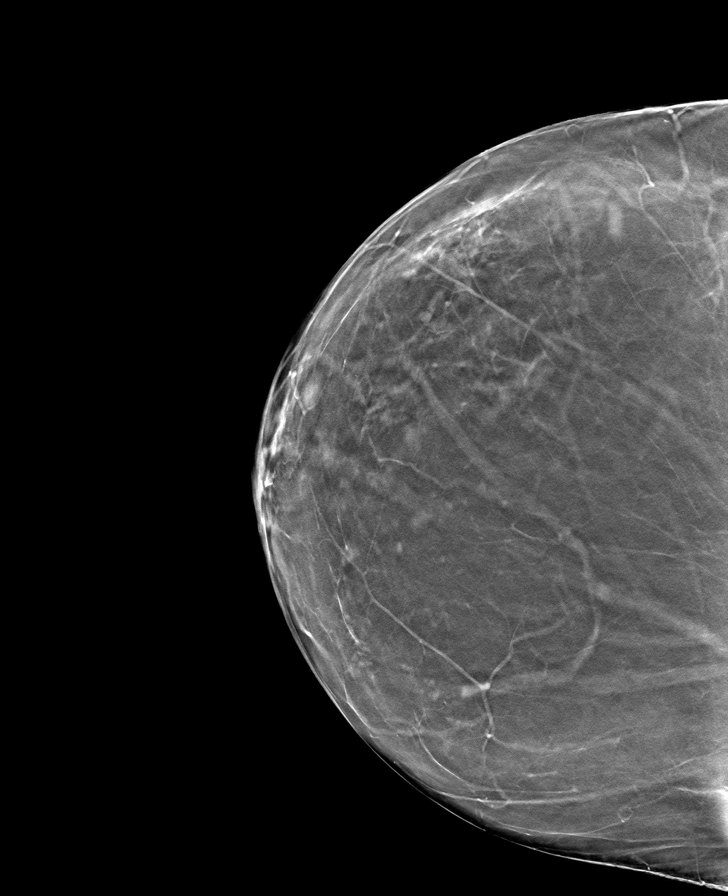

[8 of 24 positions shown; findings below may reference images not displayed]

FINDINGS: No suspicious calcifications, masses or areas of distortion are seen
in the bilateral breasts.

Mammographic images were processed with CAD.

Physical exam of the right areola demonstrates normal-appearing
skin. No rashes, crusting or oozing is seen.

Ultrasound of the retroareolar right breast demonstrates normal
fibroglandular tissue. No suspicious masses or areas of shadowing
are identified.
IMPRESSION: 1. There are no mammographic or targeted sonographic abnormalities
in the retroareolar right breast to explain the patient's itching.

2.  No mammographic evidence of malignancy in the bilateral breasts.

RECOMMENDATION:
1. Clinical follow-up recommended for the itching of the right
areola. Any further workup should be based on clinical grounds.

2.  Screening mammogram in one year.(Code:2I-J-ETG)

I have discussed the findings and recommendations with the patient.
If applicable, a reminder letter will be sent to the patient
regarding the next appointment.

BI-RADS CATEGORY  1: Negative.

## 2021-11-18 ENCOUNTER — Other Ambulatory Visit: Payer: Self-pay | Admitting: Family Medicine

## 2021-11-18 ENCOUNTER — Other Ambulatory Visit: Payer: Self-pay | Admitting: Internal Medicine

## 2021-11-18 DIAGNOSIS — Z1231 Encounter for screening mammogram for malignant neoplasm of breast: Secondary | ICD-10-CM

## 2021-12-19 ENCOUNTER — Ambulatory Visit
Admission: RE | Admit: 2021-12-19 | Discharge: 2021-12-19 | Disposition: A | Discharge status: Home / Self Care | Payer: BC Managed Care – PPO | Source: Ambulatory Visit | Attending: Internal Medicine | Admitting: Internal Medicine

## 2021-12-19 DIAGNOSIS — Z1231 Encounter for screening mammogram for malignant neoplasm of breast: Secondary | ICD-10-CM

## 2022-08-23 DIAGNOSIS — S43004A Unspecified dislocation of right shoulder joint, initial encounter: Secondary | ICD-10-CM | POA: Diagnosis not present

## 2022-08-23 DIAGNOSIS — S43014A Anterior dislocation of right humerus, initial encounter: Secondary | ICD-10-CM | POA: Diagnosis not present

## 2022-08-23 DIAGNOSIS — S59902A Unspecified injury of left elbow, initial encounter: Secondary | ICD-10-CM | POA: Diagnosis not present

## 2022-08-23 DIAGNOSIS — Y92009 Unspecified place in unspecified non-institutional (private) residence as the place of occurrence of the external cause: Secondary | ICD-10-CM | POA: Diagnosis not present

## 2022-08-23 DIAGNOSIS — M25422 Effusion, left elbow: Secondary | ICD-10-CM | POA: Diagnosis not present

## 2022-08-23 DIAGNOSIS — M25532 Pain in left wrist: Secondary | ICD-10-CM | POA: Diagnosis not present

## 2022-08-23 DIAGNOSIS — S43085A Other dislocation of left shoulder joint, initial encounter: Secondary | ICD-10-CM | POA: Diagnosis not present

## 2022-08-23 DIAGNOSIS — W1830XA Fall on same level, unspecified, initial encounter: Secondary | ICD-10-CM | POA: Diagnosis not present

## 2022-08-23 DIAGNOSIS — W010XXA Fall on same level from slipping, tripping and stumbling without subsequent striking against object, initial encounter: Secondary | ICD-10-CM | POA: Diagnosis not present

## 2022-08-23 DIAGNOSIS — S6992XA Unspecified injury of left wrist, hand and finger(s), initial encounter: Secondary | ICD-10-CM | POA: Diagnosis not present

## 2022-08-23 DIAGNOSIS — S53105A Unspecified dislocation of left ulnohumeral joint, initial encounter: Secondary | ICD-10-CM | POA: Diagnosis not present

## 2022-08-23 DIAGNOSIS — S43084A Other dislocation of right shoulder joint, initial encounter: Secondary | ICD-10-CM | POA: Diagnosis not present

## 2022-08-23 DIAGNOSIS — S52122A Displaced fracture of head of left radius, initial encounter for closed fracture: Secondary | ICD-10-CM | POA: Diagnosis not present

## 2022-08-23 DIAGNOSIS — M25522 Pain in left elbow: Secondary | ICD-10-CM | POA: Diagnosis not present

## 2022-08-27 DIAGNOSIS — M75121 Complete rotator cuff tear or rupture of right shoulder, not specified as traumatic: Secondary | ICD-10-CM | POA: Diagnosis not present

## 2022-08-27 DIAGNOSIS — M75101 Unspecified rotator cuff tear or rupture of right shoulder, not specified as traumatic: Secondary | ICD-10-CM | POA: Diagnosis not present

## 2022-08-27 DIAGNOSIS — S43004A Unspecified dislocation of right shoulder joint, initial encounter: Secondary | ICD-10-CM | POA: Diagnosis not present

## 2022-08-27 DIAGNOSIS — S52122A Displaced fracture of head of left radius, initial encounter for closed fracture: Secondary | ICD-10-CM | POA: Diagnosis not present

## 2022-08-27 DIAGNOSIS — M19011 Primary osteoarthritis, right shoulder: Secondary | ICD-10-CM | POA: Diagnosis not present

## 2022-08-27 DIAGNOSIS — M25511 Pain in right shoulder: Secondary | ICD-10-CM | POA: Diagnosis not present

## 2022-08-29 ENCOUNTER — Encounter (HOSPITAL_BASED_OUTPATIENT_CLINIC_OR_DEPARTMENT_OTHER): Payer: Self-pay | Admitting: Orthopaedic Surgery

## 2022-08-29 ENCOUNTER — Other Ambulatory Visit: Payer: Self-pay

## 2022-09-03 NOTE — H&P (Signed)
PREOPERATIVE H&P  Chief Complaint: LEFT RADIAL HEAD FRACTURE  HPI: Tracey Figueroa is a 57 y.o. female who is scheduled for, Procedure(s): RADIAL HEAD ARTHROPLASTY.   Tracey Figueroa had a fall that happened 08/23/2022.  She had acute onset right shoulder dislocation and left elbow pain.  This happened in [place].  She had the shoulder closed reduced and elbow placed in a splint.  She has been using oxycodone with Tylenol.  She has been having a sling on the right upper extremity.  Pain is severe.  It limits her quality of life.    Symptoms are rated as moderate to severe, and have been worsening.  This is significantly impairing activities of daily living.    Please see clinic note for further details on this patient's care.    She has elected for surgical management.   Past Medical History:  Diagnosis Date   Abnormal uterine bleeding (AUB)    Anxiety    Arthritis    thumbs, back, KNEES WITH OA   Asthma    COVID-19 02/19/2020   Rx'd with Paxlovid, chest tight, cough and fever x 5-6 days all symptoms resolved   Dyspnea    Obesity    Pre-diabetes    Wears glasses 09/27/2020   Past Surgical History:  Procedure Laterality Date   BACK SURGERY  2020   lumbar laminectomy   CESAREAN SECTION  03/1994   CESAREAN SECTION  06/1998   CYSTOSCOPY  10/03/2020   Procedure: CYSTOSCOPY;  Surgeon: Steva Ready, DO;  Location: [facility];  Service: Gynecology;;   DILATATION & CURETTAGE/HYSTEROSCOPY WITH MYOSURE N/A 06/08/2020   Procedure: DILATATION & CURETTAGE/ diagnostic HYSTEROSCOPY;  Surgeon: Steva Ready, DO;  Location: [facility];  Service: Gynecology;  Laterality: N/A;   HYSTERECTOMY ABDOMINAL WITH SALPINGO-OOPHORECTOMY Bilateral 10/03/2020   Procedure: HYSTERECTOMY ABDOMINAL WITH SALPINGO-OOPHORECTOMY;  Surgeon: Steva Ready, DO;  Location: [facility];  Service: Gynecology;  Laterality: Bilateral;   ROBOTIC ASSISTED  LAPAROSCOPIC HYSTERECTOMY AND SALPINGECTOMY Bilateral 10/03/2020   Procedure: ATTEMPTED XI ROBOTIC ASSISTED LAPAROSCOPIC HYSTERECTOMY AND SALPINGO-OOPHORECTOMY.;  Surgeon: Steva Ready, DO;  Location: [facility];  Service: Gynecology;  Laterality: Bilateral;   ROOT CANAL  10/2020   Social History   Socioeconomic History   Marital status: Single    Spouse name: Not on file   Number of children: Not on file   Years of education: Not on file   Highest education level: Not on file  Occupational History   Not on file  Tobacco Use   Smoking status: Never   Smokeless tobacco: Never  Vaping Use   Vaping status: Never Used  Substance and Sexual Activity   Alcohol use: Yes    Comment: social   Drug use: Never   Sexual activity: Not Currently    Birth control/protection: None  Other Topics Concern   Not on file  Social History Narrative   Not on file   Social Determinants of Health   Financial Resource Strain: Not on file  Food Insecurity: Not on file  Transportation Needs: Not on file  Physical Activity: Not on file  Stress: Not on file  Social Connections: Not on file   History reviewed. No pertinent family history. Allergies  Allergen Reactions   Erythromycin     Other reaction(s): stomach upset   Prednisone Other (See Comments)    "MAKES ME GRUMP AND HUNGRY"   Prior to Admission medications   Medication Sig Start Date End  Date Taking? Authorizing Provider  albuterol (VENTOLIN HFA) 108 (90 Base) MCG/ACT inhaler INHALE 2 PUFFS INTO THE LUNGS EVERY 4 HOURS AS NEEDED FOR WHEEZING OR SHORTNESS OF BREATH. 02/08/21  Yes Ellamae Sia, DO  ibuprofen (ADVIL) 600 MG tablet Take 1 tablet (600 mg total) by mouth every 6 (six) hours as needed for mild pain, moderate pain or cramping. 10/05/20  Yes Connye Burkitt, Melissa, DO  sertraline (ZOLOFT) 100 MG tablet Take 100 mg by mouth daily. 11/28/19  Yes [provider]  diclofenac Sodium (VOLTAREN) 1 % GEL as needed.     [provider]  Fluticasone-Salmeterol 113-14 MCG/ACT AEPB INHALE 1 PUFF INTO THE LUNGS IN THE MORNING AND AT BEDTIME. RINSE MOUTH AFTER USE 09/18/21   Ellamae Sia, DO  Nutritional Supplements (JUICE PLUS FIBRE) LIQD 2 (two) times daily. 1 VEGETABLE AND 1 FRUIT ONE BID    [provider]  tranexamic acid (LYSTEDA) 650 MG TABS tablet Take 1,300 mg by mouth 3 (three) times daily as needed. 05/01/20   [provider]  UNABLE TO FIND DoTerra ESSENTIAL OIL AFTER MEALS PRN    [provider]    ROS: All other systems have been reviewed and were otherwise negative with the exception of those mentioned in the HPI and as above.  Physical Exam: General: Alert, no acute distress Cardiovascular: No pedal edema Respiratory: No cyanosis, no use of accessory musculature GI: No organomegaly, abdomen is soft and non-tender Skin: No lesions in the area of chief complaint Neurologic: Sensation intact distally Psychiatric: Patient is competent for consent with normal mood and affect Lymphatic: No axillary or cervical lymphadenopathy  MUSCULOSKELETAL:  Height 5 feet 3 inches, weight 265 pounds.  Left elbow has significant ecchymosis and bruising.  All fingers flex, extend and abduct.  Sensation is intact over the deltoid on the right side and all fingers flex, extend and abduct on that extremity as well.    Imaging: Three views of the left elbow ordered and interpreted by me demonstrate comminuted radial head fracture.    Assessment: LEFT RADIAL HEAD FRACTURE  Plan: Plan for Procedure(s): RADIAL HEAD ARTHROPLASTY  The risks benefits and alternatives were discussed with the patient including but not limited to the risks of nonoperative treatment, versus surgical intervention including infection, bleeding, nerve injury,  blood clots, cardiopulmonary complications, morbidity, mortality, among others, and they were willing to proceed.   The patient acknowledged the  explanation, agreed to proceed with the plan and consent was signed.   Operative Plan: Left radial head arthroplasty Discharge Medications: standard DVT Prophylaxis: none Physical Therapy: outpatient PT Special Discharge needs: Splint. Sling   Vernetta Honey, PA-C  09/03/2022 4:05 PM

## 2022-09-03 NOTE — Discharge Instructions (Addendum)
Ramond Marrow MD, MPH Alfonse Alpers, PA-C [facility] [address], [address] [phone] (tel)   [phone] (fax)   POST-OPERATIVE INSTRUCTIONS   WOUND CARE Please keep splint clean dry and intact until followup.  You may shower on Post-Op Day #3.  You must keep splint dry during this process and may find that a plastic bag taped around the extremity or alternatively a towel based bath may be a better option.   If you get your splint wet or if it is damaged please contact our clinic.  EXERCISES Due to your splint being in place you will not be able to bear weight through your extremity.    You may use a sling for comfort   It is normal for your fingers/hand to become more swollen after surgery and discolored from bruising.   This will resolve over the first few weeks usually after surgery. Please continue to ambulate and do not stay sitting or lying for too long.  Perform foot and wrist pumps to assist in circulation.   REGIONAL ANESTHESIA (NERVE BLOCKS) The anesthesia team may have performed a nerve block for you this is a great tool used to minimize pain.   The block may start wearing off overnight (between 8-24 hours postop) When the block wears off, your pain may go from nearly zero to the pain you would have had postop without the block. This is an abrupt transition but nothing dangerous is happening.   This can be a challenging period but utilize your as needed pain medications to try and manage this period. We suggest you use the pain medication the first night prior to going to bed, to ease this transition.  You may take an extra dose of narcotic when this happens if needed   POST-OP MEDICATIONS- Multimodal approach to pain control In general your pain will be controlled with a combination of substances.  Prescriptions unless otherwise discussed are electronically sent to your pharmacy.  This is a carefully made plan we use to minimize  narcotic use.     Celebrex - Anti-inflammatory medication taken on a scheduled basis Acetaminophen - Non-narcotic pain medicine taken on a scheduled basis  Gabapentin - this is to help with nerve based pain, take on a scheduled basis Oxycodone - This is a strong narcotic, to be used only on an "as needed" basis for SEVERE pain. Zofran - take as needed for nausea   FOLLOW-UP If you develop a Fever (>101.5), Redness or Drainage from the surgical incision site, please call our office to arrange for an evaluation. Please call the office to schedule a follow-up appointment for your incision check if you do not already have one, 7-10 days post-operatively.   HELPFUL INFORMATION    You may be more comfortable sleeping in a semi-seated position the first few nights following surgery.  Keep a pillow propped under the elbow and forearm for comfort.  If you have a recliner type of chair it might be beneficial.  If not that is fine too, but it would be helpful to sleep propped up with pillows behind your operated shoulder as well under your elbow and forearm.  This will reduce pulling on the suture lines.   When dressing, put your operative arm in the sleeve first.  When getting undressed, take your operative arm out last.  Loose fitting, button-down shirts are recommended.  Often in the first days after surgery you may be more comfortable keeping your operative arm under your  shirt and not through the sleeve.   You may return to work/school in the next couple of days when you feel up to it.  Desk work and typing in the sling is fine.   We suggest you use the pain medication the first night prior to going to bed, in order to ease any pain when the anesthesia wears off. You should avoid taking pain medications on an empty stomach as it will make you nauseous.   You should wean off your narcotic medicines as soon as you are able.  Most patients will be off narcotics before their first postop appointment.     Do not drink alcoholic beverages or take illicit drugs when taking pain medications.   It is against the law to drive while taking narcotics.  In some states it is against the law to drive while your arm is in a sling.    Pain medication may make you constipated.  Below are a few solutions to try in this order:   - Decrease the amount of pain medication if you aren't having pain.   - Drink lots of decaffeinated fluids.   - Drink prune juice and/or each dried prunes   If the first 3 don't work start with additional solutions   - Take Colace - an over-the-counter stool softener   - Take Senokot - an over-the-counter laxative   - Take Miralax - a stronger over-the-counter laxative   For more information including helpful videos and documents visit our website:   [email or web address]    Post Anesthesia Home Care Instructions  Activity: Get plenty of rest for the remainder of the day. A responsible individual must stay with you for 24 hours following the procedure.  For the next 24 hours, DO NOT: -Drive a car -Advertising copywriter -Drink alcoholic beverages -Take any medication unless instructed by your physician -Make any legal decisions or sign important papers.  Meals: Start with liquid foods such as gelatin or soup. Progress to regular foods as tolerated. Avoid greasy, spicy, heavy foods. If nausea and/or vomiting occur, drink only clear liquids until the nausea and/or vomiting subsides. Call your physician if vomiting continues.  Special Instructions/Symptoms: Your throat may feel dry or sore from the anesthesia or the breathing tube placed in your throat during surgery. If this causes discomfort, gargle with warm salt water. The discomfort should disappear within 24 hours.  If you had a scopolamine patch placed behind your ear for the management of post- operative nausea and/or vomiting:  1. The medication in the patch is effective for 72  hours, after which it should be removed.  Wrap patch in a tissue and discard in the trash. Wash hands thoroughly with soap and water. 2. You may remove the patch earlier than 72 hours if you experience unpleasant side effects which may include dry mouth, dizziness or visual disturbances. 3. Avoid touching the patch. Wash your hands with soap and water after contact with the patch.     Regional Anesthesia Blocks  1. You may not be able to move or feel the "blocked" extremity after a regional anesthetic block. This may last may last from 3-48 hours after placement, but it will go away. The length of time depends on the medication injected and your individual response to the medication. As the nerves start to wake up, you may experience tingling as the movement and feeling returns to your extremity. If the numbness and inability to move your extremity has not gone away after 48  hours, please call your surgeon.   2. The extremity that is blocked will need to be protected until the numbness is gone and the strength has returned. Because you cannot feel it, you will need to take extra care to avoid injury. Because it may be weak, you may have difficulty moving it or using it. You may not know what position it is in without looking at it while the block is in effect.  3. For blocks in the legs and feet, returning to weight bearing and walking needs to be done carefully. You will need to wait until the numbness is entirely gone and the strength has returned. You should be able to move your leg and foot normally before you try and bear weight or walk. You will need someone to be with you when you first try to ensure you do not fall and possibly risk injury.  4. Bruising and tenderness at the needle site are common side effects and will resolve in a few days.  5. Persistent numbness or new problems with movement should be communicated to the surgeon or the [facility] [phone] [facility] [phone]).Information for Discharge Teaching:   EXPAREL (bupivacaine liposome injectable suspension)   Pain relief is important to your recovery. The goal is to control your pain so you can move easier and return to your normal activities as soon as possible after your procedure. Your physician may use several types of medicines to manage pain, swelling, and more.  Your surgeon or anesthesiologist gave you EXPAREL(bupivacaine) to help control your pain after surgery.  EXPAREL is a local anesthetic designed to release slowly over an extended period of time to provide pain relief by numbing the tissue around the surgical site. EXPAREL is designed to release pain medication over time and can control pain for up to 72 hours. Depending on how you respond to EXPAREL, you may require less pain medication during your recovery. EXPAREL can help reduce or eliminate the need for opioids during the first few days after surgery when pain relief is needed the most. EXPAREL is not an opioid and is not addictive. It does not cause sleepiness or sedation.   Important! A teal colored band has been placed on your arm with the date, time and amount of EXPAREL you have received. Please leave this armband in place for the full 96 hours following administration, and then you may remove the band. If you return to the hospital for any reason within 96 hours following the administration of EXPAREL, the armband provides important information that your health care providers to know, and alerts them that you have received this anesthetic.    Possible side effects of EXPAREL: Temporary loss of sensation or ability to move in the area where medication was injected. Nausea, vomiting, constipation Rarely, numbness and tingling in your mouth or lips, lightheadedness, or anxiety may occur. Call your doctor right away if you think you may be experiencing any of these sensations, or if you have other questions  regarding possible side effects.  Follow all other discharge instructions given to you by your surgeon or nurse. Eat a healthy diet and drink plenty of water or other fluids.  Next dose of tylenol available at 2pm

## 2022-09-04 ENCOUNTER — Ambulatory Visit (HOSPITAL_BASED_OUTPATIENT_CLINIC_OR_DEPARTMENT_OTHER): Payer: BC Managed Care – PPO

## 2022-09-04 ENCOUNTER — Encounter (HOSPITAL_BASED_OUTPATIENT_CLINIC_OR_DEPARTMENT_OTHER)
Admission: RE | Disposition: A | Discharge status: Home / Self Care | Payer: Self-pay | Source: Home / Self Care | Attending: Orthopaedic Surgery

## 2022-09-04 ENCOUNTER — Other Ambulatory Visit: Payer: Self-pay

## 2022-09-04 ENCOUNTER — Ambulatory Visit (HOSPITAL_BASED_OUTPATIENT_CLINIC_OR_DEPARTMENT_OTHER)
Admission: RE | Admit: 2022-09-04 | Discharge: 2022-09-05 | Disposition: A | Discharge status: Home / Self Care | Payer: BC Managed Care – PPO | Attending: Orthopaedic Surgery | Admitting: Orthopaedic Surgery

## 2022-09-04 ENCOUNTER — Ambulatory Visit (HOSPITAL_BASED_OUTPATIENT_CLINIC_OR_DEPARTMENT_OTHER): Payer: Self-pay

## 2022-09-04 ENCOUNTER — Encounter (HOSPITAL_BASED_OUTPATIENT_CLINIC_OR_DEPARTMENT_OTHER): Payer: Self-pay | Admitting: Orthopaedic Surgery

## 2022-09-04 DIAGNOSIS — W19XXXA Unspecified fall, initial encounter: Secondary | ICD-10-CM | POA: Diagnosis not present

## 2022-09-04 DIAGNOSIS — D649 Anemia, unspecified: Secondary | ICD-10-CM | POA: Diagnosis not present

## 2022-09-04 DIAGNOSIS — S53432A Radial collateral ligament sprain of left elbow, initial encounter: Secondary | ICD-10-CM | POA: Diagnosis not present

## 2022-09-04 DIAGNOSIS — S52122A Displaced fracture of head of left radius, initial encounter for closed fracture: Secondary | ICD-10-CM | POA: Diagnosis present

## 2022-09-04 DIAGNOSIS — G8918 Other acute postprocedural pain: Secondary | ICD-10-CM | POA: Diagnosis not present

## 2022-09-04 HISTORY — PX: RADIAL HEAD ARTHROPLASTY: SHX6044

## 2022-09-04 SURGERY — ARTHROPLASTY, RADIUS, HEAD
Anesthesia: General | Site: Arm Lower | Laterality: Left

## 2022-09-04 MED ORDER — CELECOXIB 200 MG PO CAPS
200.0000 mg | ORAL_CAPSULE | Freq: Two times a day (BID) | ORAL | Status: DC
  Administered 2022-09-04: 200 mg via ORAL
  Filled 2022-09-04: qty 1

## 2022-09-04 MED ORDER — VANCOMYCIN HCL 1000 MG IV SOLR
INTRAVENOUS | Status: DC | PRN
  Administered 2022-09-04: 1000 mg via TOPICAL

## 2022-09-04 MED ORDER — ONDANSETRON HCL 4 MG/2ML IJ SOLN: 4.0000 mg | Freq: Four times a day (QID) | INTRAMUSCULAR | Status: DC | PRN

## 2022-09-04 MED ORDER — ALBUTEROL SULFATE (2.5 MG/3ML) 0.083% IN NEBU
INHALATION_SOLUTION | RESPIRATORY_TRACT | Status: AC
  Filled 2022-09-04: qty 3

## 2022-09-04 MED ORDER — EPHEDRINE 5 MG/ML INJ
INTRAVENOUS | Status: AC
  Filled 2022-09-04: qty 5

## 2022-09-04 MED ORDER — SUGAMMADEX SODIUM 200 MG/2ML IV SOLN
INTRAVENOUS | Status: DC | PRN
  Administered 2022-09-04: 300 mg via INTRAVENOUS

## 2022-09-04 MED ORDER — ACETAMINOPHEN 500 MG PO TABS
1000.0000 mg | ORAL_TABLET | Freq: Once | ORAL | Status: AC
  Administered 2022-09-04: 1000 mg via ORAL

## 2022-09-04 MED ORDER — BUPIVACAINE LIPOSOME 1.3 % IJ SUSP
INTRAMUSCULAR | Status: DC | PRN
  Administered 2022-09-04: 10 mL via PERINEURAL

## 2022-09-04 MED ORDER — ACETAMINOPHEN 500 MG PO TABS
1000.0000 mg | ORAL_TABLET | Freq: Four times a day (QID) | ORAL | Status: DC
  Administered 2022-09-04 – 2022-09-05 (×3): 1000 mg via ORAL
  Filled 2022-09-04 (×3): qty 2

## 2022-09-04 MED ORDER — POLYETHYLENE GLYCOL 3350 17 G PO PACK: 17.0000 g | PACK | Freq: Every day | ORAL | Status: DC | PRN

## 2022-09-04 MED ORDER — HYDROMORPHONE HCL 1 MG/ML IJ SOLN
0.5000 mg | INTRAMUSCULAR | Status: DC | PRN
  Administered 2022-09-04 (×2): 0.25 mg via INTRAVENOUS

## 2022-09-04 MED ORDER — OXYCODONE HCL 5 MG PO TABS: ORAL_TABLET | ORAL | 0 refills | Status: AC

## 2022-09-04 MED ORDER — GABAPENTIN 100 MG PO CAPS
ORAL_CAPSULE | ORAL | Status: AC
  Filled 2022-09-04: qty 1

## 2022-09-04 MED ORDER — LACTATED RINGERS IV SOLN: INTRAVENOUS | Status: DC

## 2022-09-04 MED ORDER — GABAPENTIN 300 MG PO CAPS
ORAL_CAPSULE | ORAL | Status: AC
  Filled 2022-09-04: qty 1

## 2022-09-04 MED ORDER — OXYCODONE HCL 5 MG PO TABS: 10.0000 mg | ORAL_TABLET | ORAL | Status: DC | PRN

## 2022-09-04 MED ORDER — BISACODYL 5 MG PO TBEC: 5.0000 mg | DELAYED_RELEASE_TABLET | Freq: Every day | ORAL | Status: DC | PRN

## 2022-09-04 MED ORDER — CEFAZOLIN SODIUM-DEXTROSE 2-4 GM/100ML-% IV SOLN
2.0000 g | Freq: Four times a day (QID) | INTRAVENOUS | Status: AC
  Administered 2022-09-04 – 2022-09-05 (×3): 2 g via INTRAVENOUS
  Filled 2022-09-04 (×3): qty 100

## 2022-09-04 MED ORDER — FENTANYL CITRATE (PF) 100 MCG/2ML IJ SOLN
INTRAMUSCULAR | Status: AC
  Filled 2022-09-04: qty 2

## 2022-09-04 MED ORDER — ROCURONIUM BROMIDE 100 MG/10ML IV SOLN
INTRAVENOUS | Status: DC | PRN
  Administered 2022-09-04: 50 mg via INTRAVENOUS

## 2022-09-04 MED ORDER — ONDANSETRON HCL 4 MG/2ML IJ SOLN
INTRAMUSCULAR | Status: DC | PRN
Start: 2022-09-04 — End: 2022-09-04
  Administered 2022-09-04: 4 mg via INTRAVENOUS

## 2022-09-04 MED ORDER — KETOROLAC TROMETHAMINE 30 MG/ML IJ SOLN
INTRAMUSCULAR | Status: AC
  Filled 2022-09-04: qty 1

## 2022-09-04 MED ORDER — SERTRALINE HCL 100 MG PO TABS
100.0000 mg | ORAL_TABLET | Freq: Every day | ORAL | Status: DC
  Filled 2022-09-04: qty 1

## 2022-09-04 MED ORDER — KETOROLAC TROMETHAMINE 15 MG/ML IJ SOLN
15.0000 mg | Freq: Once | INTRAMUSCULAR | Status: AC
  Administered 2022-09-04: 15 mg via INTRAVENOUS

## 2022-09-04 MED ORDER — EPHEDRINE SULFATE (PRESSORS) 50 MG/ML IJ SOLN
INTRAMUSCULAR | Status: DC | PRN
  Administered 2022-09-04 (×2): 5 mg via INTRAVENOUS

## 2022-09-04 MED ORDER — HYDROMORPHONE HCL 1 MG/ML IJ SOLN: 0.5000 mg | INTRAMUSCULAR | Status: DC | PRN

## 2022-09-04 MED ORDER — OXYCODONE HCL 5 MG PO TABS
5.0000 mg | ORAL_TABLET | Freq: Once | ORAL | Status: AC
  Administered 2022-09-04: 5 mg via ORAL

## 2022-09-04 MED ORDER — ROCURONIUM BROMIDE 10 MG/ML (PF) SYRINGE
PREFILLED_SYRINGE | INTRAVENOUS | Status: AC
  Filled 2022-09-04: qty 10

## 2022-09-04 MED ORDER — FENTANYL CITRATE (PF) 100 MCG/2ML IJ SOLN
INTRAMUSCULAR | Status: DC | PRN
  Administered 2022-09-04 (×2): 50 ug via INTRAVENOUS

## 2022-09-04 MED ORDER — FENTANYL CITRATE (PF) 100 MCG/2ML IJ SOLN
100.0000 ug | Freq: Once | INTRAMUSCULAR | Status: AC
  Administered 2022-09-04: 100 ug via INTRAVENOUS

## 2022-09-04 MED ORDER — METHOCARBAMOL 1000 MG/10ML IJ SOLN: 500.0000 mg | Freq: Four times a day (QID) | INTRAVENOUS | Status: DC | PRN

## 2022-09-04 MED ORDER — MIDAZOLAM HCL 2 MG/2ML IJ SOLN
INTRAMUSCULAR | Status: AC
  Filled 2022-09-04: qty 2

## 2022-09-04 MED ORDER — ONDANSETRON HCL 4 MG PO TABS: 4.0000 mg | ORAL_TABLET | Freq: Three times a day (TID) | ORAL | 0 refills | Status: AC | PRN

## 2022-09-04 MED ORDER — 0.9 % SODIUM CHLORIDE (POUR BTL) OPTIME
TOPICAL | Status: DC | PRN
  Administered 2022-09-04: 1000 mL

## 2022-09-04 MED ORDER — GABAPENTIN 100 MG PO CAPS: 100.0000 mg | ORAL_CAPSULE | Freq: Three times a day (TID) | ORAL | 0 refills | Status: AC

## 2022-09-04 MED ORDER — DOCUSATE SODIUM 100 MG PO CAPS: 100.0000 mg | ORAL_CAPSULE | Freq: Two times a day (BID) | ORAL | Status: DC

## 2022-09-04 MED ORDER — GABAPENTIN 300 MG PO CAPS
300.0000 mg | ORAL_CAPSULE | Freq: Once | ORAL | Status: AC
  Administered 2022-09-04: 300 mg via ORAL

## 2022-09-04 MED ORDER — OXYCODONE HCL 5 MG PO TABS: 5.0000 mg | ORAL_TABLET | ORAL | Status: DC | PRN

## 2022-09-04 MED ORDER — ALBUTEROL SULFATE (2.5 MG/3ML) 0.083% IN NEBU
2.5000 mg | INHALATION_SOLUTION | Freq: Four times a day (QID) | RESPIRATORY_TRACT | Status: DC | PRN
  Administered 2022-09-04: 2.5 mg via RESPIRATORY_TRACT

## 2022-09-04 MED ORDER — ACETAMINOPHEN 500 MG PO TABS
ORAL_TABLET | ORAL | Status: AC
  Filled 2022-09-04: qty 2

## 2022-09-04 MED ORDER — ONDANSETRON HCL 4 MG PO TABS: 4.0000 mg | ORAL_TABLET | Freq: Four times a day (QID) | ORAL | Status: DC | PRN

## 2022-09-04 MED ORDER — PROPOFOL 10 MG/ML IV BOLUS
INTRAVENOUS | Status: DC | PRN
Start: 2022-09-04 — End: 2022-09-04
  Administered 2022-09-04: 160 mg via INTRAVENOUS

## 2022-09-04 MED ORDER — MIDAZOLAM HCL 2 MG/2ML IJ SOLN
2.0000 mg | Freq: Once | INTRAMUSCULAR | Status: AC
  Administered 2022-09-04: 2 mg via INTRAVENOUS

## 2022-09-04 MED ORDER — METOCLOPRAMIDE HCL 5 MG PO TABS: 5.0000 mg | ORAL_TABLET | Freq: Three times a day (TID) | ORAL | Status: DC | PRN

## 2022-09-04 MED ORDER — LIDOCAINE 2% (20 MG/ML) 5 ML SYRINGE
INTRAMUSCULAR | Status: DC | PRN
  Administered 2022-09-04: 60 mg via INTRAVENOUS

## 2022-09-04 MED ORDER — METOCLOPRAMIDE HCL 5 MG/ML IJ SOLN: 5.0000 mg | Freq: Three times a day (TID) | INTRAMUSCULAR | Status: DC | PRN

## 2022-09-04 MED ORDER — ONDANSETRON HCL 4 MG/2ML IJ SOLN: 4.0000 mg | Freq: Once | INTRAMUSCULAR | Status: DC | PRN

## 2022-09-04 MED ORDER — CEFAZOLIN SODIUM-DEXTROSE 2-4 GM/100ML-% IV SOLN: 2.0000 g | INTRAVENOUS | Status: DC

## 2022-09-04 MED ORDER — VANCOMYCIN HCL 1000 MG IV SOLR
INTRAVENOUS | Status: AC
  Filled 2022-09-04: qty 20

## 2022-09-04 MED ORDER — CEFAZOLIN IN SODIUM CHLORIDE 3-0.9 GM/100ML-% IV SOLN
3.0000 g | INTRAVENOUS | Status: AC
  Administered 2022-09-04: 3 g via INTRAVENOUS

## 2022-09-04 MED ORDER — FENTANYL CITRATE (PF) 100 MCG/2ML IJ SOLN
25.0000 ug | INTRAMUSCULAR | Status: DC | PRN
  Administered 2022-09-04 (×2): 50 ug via INTRAVENOUS

## 2022-09-04 MED ORDER — HYDROMORPHONE HCL 1 MG/ML IJ SOLN
INTRAMUSCULAR | Status: AC
  Filled 2022-09-04: qty 0.5

## 2022-09-04 MED ORDER — ACETAMINOPHEN 500 MG PO TABS: 1000.0000 mg | ORAL_TABLET | Freq: Three times a day (TID) | ORAL | 0 refills | Status: AC

## 2022-09-04 MED ORDER — GABAPENTIN 100 MG PO CAPS
100.0000 mg | ORAL_CAPSULE | Freq: Three times a day (TID) | ORAL | Status: DC
  Administered 2022-09-04: 100 mg via ORAL

## 2022-09-04 MED ORDER — CEFAZOLIN IN SODIUM CHLORIDE 3-0.9 GM/100ML-% IV SOLN
INTRAVENOUS | Status: AC
  Filled 2022-09-04: qty 100

## 2022-09-04 MED ORDER — OXYCODONE HCL 5 MG PO TABS
ORAL_TABLET | ORAL | Status: AC
  Filled 2022-09-04: qty 1

## 2022-09-04 MED ORDER — DEXAMETHASONE SODIUM PHOSPHATE 10 MG/ML IJ SOLN
INTRAMUSCULAR | Status: DC | PRN
  Administered 2022-09-04: 5 mg via INTRAVENOUS

## 2022-09-04 MED ORDER — AMISULPRIDE (ANTIEMETIC) 5 MG/2ML IV SOLN: 10.0000 mg | Freq: Once | INTRAVENOUS | Status: DC | PRN

## 2022-09-04 MED ORDER — KETOROLAC TROMETHAMINE 15 MG/ML IJ SOLN
INTRAMUSCULAR | Status: AC
  Filled 2022-09-04: qty 1

## 2022-09-04 MED ORDER — METHOCARBAMOL 500 MG PO TABS: 500.0000 mg | ORAL_TABLET | Freq: Four times a day (QID) | ORAL | Status: DC | PRN

## 2022-09-04 MED ORDER — BUPIVACAINE HCL (PF) 0.5 % IJ SOLN
INTRAMUSCULAR | Status: DC | PRN
  Administered 2022-09-04: 10 mL via PERINEURAL

## 2022-09-04 MED ORDER — CELECOXIB 100 MG PO CAPS: 100.0000 mg | ORAL_CAPSULE | Freq: Two times a day (BID) | ORAL | 0 refills | Status: AC

## 2022-09-04 SURGICAL SUPPLY — 64 items
ANCH SUT NDL DX FBRTK STRL LF (Anchor) ×2 IMPLANT
ANCHOR SUT FBRTK 1.3 SUTTAP (Anchor) IMPLANT
APL PRP STRL LF DISP 70% ISPRP (MISCELLANEOUS) ×1
BLADE AVERAGE 25X9 (BLADE) ×1 IMPLANT
BLADE HEX COATED 2.75 (ELECTRODE) ×1 IMPLANT
BLADE SURG 10 STRL SS (BLADE) ×1 IMPLANT
BLADE SURG 15 STRL LF DISP TIS (BLADE) ×1 IMPLANT
BLADE SURG 15 STRL SS (BLADE) ×1
BNDG CMPR 5X4 KNIT ELC UNQ LF (GAUZE/BANDAGES/DRESSINGS) ×1
BNDG CMPR 9X4 STRL LF SNTH (GAUZE/BANDAGES/DRESSINGS) ×1
BNDG ELASTIC 4INX 5YD STR LF (GAUZE/BANDAGES/DRESSINGS) ×1 IMPLANT
BNDG ESMARK 4X9 LF (GAUZE/BANDAGES/DRESSINGS) ×1 IMPLANT
CHLORAPREP W/TINT 26 (MISCELLANEOUS) ×1 IMPLANT
CLSR STERI-STRIP ANTIMIC 1/2X4 (GAUZE/BANDAGES/DRESSINGS) ×1 IMPLANT
CUFF TOURN SGL QUICK 18X3 (MISCELLANEOUS) ×1 IMPLANT
CUFF TOURN SGL QUICK 24 (TOURNIQUET CUFF)
CUFF TRNQT CYL 24X4X16.5-23 (TOURNIQUET CUFF) IMPLANT
DRAPE EXTREMITY T 121X128X90 (DISPOSABLE) ×1 IMPLANT
DRAPE INCISE IOBAN 66X45 STRL (DRAPES) IMPLANT
DRAPE OEC MINIVIEW 54X84 (DRAPES) IMPLANT
DRAPE U-SHAPE 47X51 STRL (DRAPES) ×1 IMPLANT
ELECT REM PT RETURN 9FT ADLT (ELECTROSURGICAL) ×1
ELECTRODE REM PT RTRN 9FT ADLT (ELECTROSURGICAL) ×1 IMPLANT
GAUZE SPONGE 4X4 12PLY STRL (GAUZE/BANDAGES/DRESSINGS) ×1 IMPLANT
GLOVE BIO SURGEON STRL SZ 6.5 (GLOVE) ×1 IMPLANT
GLOVE BIOGEL PI IND STRL 6.5 (GLOVE) ×1 IMPLANT
GLOVE BIOGEL PI IND STRL 8 (GLOVE) ×1 IMPLANT
GLOVE ECLIPSE 8.0 STRL XLNG CF (GLOVE) ×1 IMPLANT
GOWN STRL REUS W/ TWL LRG LVL3 (GOWN DISPOSABLE) ×2 IMPLANT
GOWN STRL REUS W/TWL LRG LVL3 (GOWN DISPOSABLE) ×2
GOWN STRL REUS W/TWL XL LVL3 (GOWN DISPOSABLE) ×1 IMPLANT
HEAD RADIAL ALIGN 20MM (Head) IMPLANT
KIT FIBERTAK DX 1.6 DISP (KITS) IMPLANT
NS IRRIG 1000ML POUR BTL (IV SOLUTION) ×1 IMPLANT
PACK ARTHROSCOPY DSU (CUSTOM PROCEDURE TRAY) ×1 IMPLANT
PACK BASIN DAY SURGERY FS (CUSTOM PROCEDURE TRAY) ×1 IMPLANT
PAD CAST 4YDX4 CTTN HI CHSV (CAST SUPPLIES) ×1 IMPLANT
PADDING CAST COTTON 4X4 STRL (CAST SUPPLIES) ×1
PENCIL SMOKE EVACUATOR (MISCELLANEOUS) ×1 IMPLANT
RETRIEVER SUT HEWSON (MISCELLANEOUS) IMPLANT
SHEET MEDIUM DRAPE 40X70 STRL (DRAPES) ×1 IMPLANT
SLEEVE SCD COMPRESS KNEE MED (STOCKING) IMPLANT
SLING ARM FOAM STRAP LRG (SOFTGOODS) ×1 IMPLANT
SPIKE FLUID TRANSFER (MISCELLANEOUS) IMPLANT
SPLINT PLASTER CAST FAST 5X30 (CAST SUPPLIES) ×10 IMPLANT
SPONGE T-LAP 18X18 ~~LOC~~+RFID (SPONGE) ×1 IMPLANT
STEM IMPLANT ELBOW ALIGN 9X0 (Stem) IMPLANT
SUCTION TUBE FRAZIER 10FR DISP (SUCTIONS) IMPLANT
SUT FIBERWIRE 2-0 18 17.9 3/8 (SUTURE)
SUT MAXBRAID #2 CVD NDL (SUTURE) IMPLANT
SUT MNCRL AB 4-0 PS2 18 (SUTURE) ×1 IMPLANT
SUT VIC AB 0 CT1 27 (SUTURE) ×1
SUT VIC AB 0 CT1 27XBRD ANBCTR (SUTURE) ×1 IMPLANT
SUT VIC AB 1 CT1 27 (SUTURE) ×1
SUT VIC AB 1 CT1 27XBRD ANBCTR (SUTURE) IMPLANT
SUT VIC AB 2-0 CT1 (SUTURE) IMPLANT
SUT VIC AB 2-0 SH 27 (SUTURE) ×1
SUT VIC AB 2-0 SH 27XBRD (SUTURE) ×1 IMPLANT
SUT VIC AB 3-0 SH 27 (SUTURE) ×1
SUT VIC AB 3-0 SH 27X BRD (SUTURE) IMPLANT
SUTURE FIBERWR 2-0 18 17.9 3/8 (SUTURE) IMPLANT
SYR BULB EAR ULCER 3OZ GRN STR (SYRINGE) ×1 IMPLANT
TOWEL GREEN STERILE FF (TOWEL DISPOSABLE) ×1 IMPLANT
TUBE SUCTION HIGH CAP CLEAR NV (SUCTIONS) ×1 IMPLANT

## 2022-09-04 NOTE — Interval H&P Note (Signed)
All questions answered, patient wants to proceed with procedure. ? ?

## 2022-09-04 NOTE — Anesthesia Procedure Notes (Signed)
Anesthesia Regional Block: Interscalene brachial plexus block   Pre-Anesthetic Checklist: , timeout performed,  Correct Patient, Correct Site, Correct Laterality,  Correct Procedure, Correct Position, site marked,  Risks and benefits discussed,  Surgical consent,  Pre-op evaluation,  At surgeon's request and post-op pain management  Laterality: Left  Prep: chloraprep       Needles:  Injection technique: Single-shot  Needle Type: Echogenic Needle     Needle Length: 9cm  Needle Gauge: 21     Additional Needles:   Procedures:,,,, ultrasound used (permanent image in chart),,    Narrative:  Start time: 09/04/2022 10:30 AM End time: 09/04/2022 10:37 AM Injection made incrementally with aspirations every 5 mL.  Performed by: Personally  Anesthesiologist: Collene Schlichter, MD  Additional Notes: No pain on injection. No increased resistance to injection. Injection made in 5cc increments.  Good needle visualization.  Patient tolerated procedure well.

## 2022-09-04 NOTE — Progress Notes (Signed)
Assisted Dr. Turk with right, interscalene , ultrasound guided block. Side rails up, monitors on throughout procedure. See vital signs in flow sheet. Tolerated Procedure well. 

## 2022-09-04 NOTE — Anesthesia Preprocedure Evaluation (Addendum)
Anesthesia Evaluation  Patient identified by MRN, date of birth, ID band Patient awake    Reviewed: Allergy & Precautions, NPO status , Patient's Chart, lab work & pertinent test results  History of Anesthesia Complications Negative for: history of anesthetic complications  Airway Mallampati: III  TM Distance: >3 FB Neck ROM: Full    Dental  (+) Teeth Intact, Dental Advisory Given   Pulmonary asthma    Pulmonary exam normal breath sounds clear to auscultation       Cardiovascular Exercise Tolerance: Good negative cardio ROS Normal cardiovascular exam Rhythm:Regular Rate:Normal     Neuro/Psych  PSYCHIATRIC DISORDERS Anxiety     negative neurological ROS     GI/Hepatic negative GI ROS, Neg liver ROS,,,  Endo/Other  diabetes, Type 2  Morbid obesity  Renal/GU negative Renal ROS     Musculoskeletal  (+) Arthritis ,  LEFT RADIAL HEAD FRACTURE   Abdominal   Peds  Hematology negative hematology ROS (+)   Anesthesia Other Findings Day of surgery medications reviewed with the patient.  Reproductive/Obstetrics                             Anesthesia Physical Anesthesia Plan  ASA: 3  Anesthesia Plan: General   Post-op Pain Management: Regional block* and Tylenol PO (pre-op)*   Induction: Intravenous  PONV Risk Score and Plan: 3 and Midazolam, Dexamethasone and Ondansetron  Airway Management Planned: Oral ETT  Additional Equipment:   Intra-op Plan:   Post-operative Plan: Extubation in OR  Informed Consent: I have reviewed the patients History and Physical, chart, labs and discussed the procedure including the risks, benefits and alternatives for the proposed anesthesia with the patient or authorized representative who has indicated his/her understanding and acceptance.     Dental advisory given  Plan Discussed with: CRNA  Anesthesia Plan Comments:        Anesthesia Quick  Evaluation

## 2022-09-04 NOTE — Anesthesia Postprocedure Evaluation (Signed)
Anesthesia Post Note  Patient: Tracey Figueroa  Procedure(s) Performed: RADIAL HEAD ARTHROPLASTY (Left: Arm Lower)     Patient location during evaluation: PACU Anesthesia Type: General Level of consciousness: awake and alert Pain management: pain level controlled Vital Signs Assessment: post-procedure vital signs reviewed and stable Respiratory status: spontaneous breathing, nonlabored ventilation and patient connected to nasal cannula oxygen Cardiovascular status: blood pressure returned to baseline and stable Postop Assessment: no apparent nausea or vomiting Anesthetic complications: no Comments: Patient having trouble maintaining oxygen saturations in PACU. Minimal wheezing with hx of asthma, not much improvement following nebulizer tx. Incentive spirometer encouraged, again, with little improvement. SaO2 hovering around 90% when awake, but dips to low-mid 80's when asleep. Patient does admit to difficulty taking deep breaths, likely related to interscalene block done preoperatively. Also concern for undiagnosed OSA given habitus. Will keep the patient overnight in RCC to monitor. Expect rapid improvement once effect of nerve block dissipates. Surgeon aware.   No notable events documented.  Last Vitals:  Vitals:   09/04/22 1500 09/04/22 1515  BP: 130/75 130/63  Pulse: 98 94  Resp:    Temp:    SpO2: (!) 85% (!) 89%    Last Pain:  Vitals:   09/04/22 1500  TempSrc:   PainSc: 6                  Beryle Lathe

## 2022-09-04 NOTE — Anesthesia Procedure Notes (Signed)
Procedure Name: Intubation Date/Time: 09/04/2022 11:08 AM  Performed by: Lauralyn Primes, CRNAPre-anesthesia Checklist: Patient identified, Emergency Drugs available, Suction available and Patient being monitored Patient Re-evaluated:Patient Re-evaluated prior to induction Oxygen Delivery Method: Circle system utilized Preoxygenation: Pre-oxygenation with 100% oxygen Induction Type: IV induction Ventilation: Mask ventilation without difficulty and Oral airway inserted - appropriate to patient size Laryngoscope Size: Mac and 3 Grade View: Grade I Tube type: Oral Tube size: 7.0 mm Number of attempts: 1 Airway Equipment and Method: Stylet, Oral airway and Bite block Placement Confirmation: ETT inserted through vocal cords under direct vision, positive ETCO2 and breath sounds checked- equal and bilateral Secured at: 22 cm Tube secured with: Tape Dental Injury: Teeth and Oropharynx as per pre-operative assessment

## 2022-09-04 NOTE — Transfer of Care (Signed)
Immediate Anesthesia Transfer of Care Note  Patient: Tracey Figueroa  Procedure(s) Performed: RADIAL HEAD ARTHROPLASTY (Left: Arm Lower)  Patient Location: PACU  Anesthesia Type:General and Regional  Level of Consciousness: awake, alert , and oriented  Airway & Oxygen Therapy: Patient Spontanous Breathing and Patient connected to face mask oxygen  Post-op Assessment: Report given to RN and Post -op Vital signs reviewed and stable  Post vital signs: Reviewed and stable  Last Vitals:  Vitals Value Taken Time  BP 168/91 09/04/22 1219  Temp    Pulse 87 09/04/22 1220  Resp 14 09/04/22 1220  SpO2 94 % 09/04/22 1220  Vitals shown include unfiled device data.  Last Pain:  Vitals:   09/04/22 1014  TempSrc: Temporal  PainSc: 2       Patients Stated Pain Goal: 4 (09/04/22 1014)  Complications: No notable events documented.

## 2022-09-05 DIAGNOSIS — S52122A Displaced fracture of head of left radius, initial encounter for closed fracture: Secondary | ICD-10-CM | POA: Diagnosis not present

## 2022-09-05 DIAGNOSIS — W19XXXA Unspecified fall, initial encounter: Secondary | ICD-10-CM | POA: Diagnosis not present

## 2022-09-05 NOTE — Discharge Summary (Signed)
Patient ID: Tracey Figueroa MRN: [id number] DOB/AGE: [birth year] 57 y.o.  Admit date: 09/04/2022 Discharge date: 09/05/2022  Admission Diagnoses: Left radial head fracture  Discharge Diagnoses:  Principal Problem:   Left radial head fracture   Past Medical History:  Diagnosis Date   Abnormal uterine bleeding (AUB)    Anxiety    Arthritis    thumbs, back, KNEES WITH OA   Asthma    COVID-19 02/19/2020   Rx'd with Paxlovid, chest tight, cough and fever x 5-6 days all symptoms resolved   Dyspnea    Obesity    Pre-diabetes    Wears glasses 09/27/2020    Procedures Performed:  - Left radial head replacement - Left lateral ligament repair  Discharged Condition: stable  Hospital Course: Patient brought in as an outpatient for surgery.  She tolerated procedure well. The patient was be admitted overnight secondary to block related hypoxia. She was found to be stable for DC home the morning after surgery.  Patient was instructed on specific activity restrictions and all questions were answered.   Consults: None  Significant Diagnostic Studies: No additional pertinent studies  Treatments: Surgery  Discharge Exam: RUE: Splint CDI. Skin intact though cannot assess fully beneath splint. Nontender to palpation proximally. + Motor in  AIN, PIN, Ulnar distributions. Sensation intact in medial, radial, and ulnar distributions. Some paresthesia in the right thumb as block is still in affect. Well perfused digits.    Disposition: Discharge disposition: 01-Home or Self Care       Discharge Instructions     Call MD for:  redness, tenderness, or signs of infection (pain, swelling, redness, odor or green/yellow discharge around incision site)   Complete by: As directed    Call MD for:  severe uncontrolled pain   Complete by: As directed    Call MD for:  temperature >100.4   Complete by: As directed    Diet - low sodium heart healthy   Complete by: As directed        Allergies as of 09/05/2022       Reactions   Erythromycin    Other reaction(s): stomach upset   Prednisone Other (See Comments)   "MAKES ME GRUMP AND HUNGRY"        Medication List     STOP taking these medications    ibuprofen 600 MG tablet Commonly known as: ADVIL       TAKE these medications    acetaminophen 500 MG tablet Commonly known as: TYLENOL Take 2 tablets (1,000 mg total) by mouth every 8 (eight) hours for 14 days.   albuterol 108 (90 Base) MCG/ACT inhaler Commonly known as: VENTOLIN HFA INHALE 2 PUFFS INTO THE LUNGS EVERY 4 HOURS AS NEEDED FOR WHEEZING OR SHORTNESS OF BREATH.   celecoxib 100 MG capsule Commonly known as: CeleBREX Take 1 capsule (100 mg total) by mouth 2 (two) times daily. For 2 weeks. Then take as needed   diclofenac Sodium 1 % Gel Commonly known as: VOLTAREN as needed.   Fluticasone-Salmeterol 113-14 MCG/ACT Aepb INHALE 1 PUFF INTO THE LUNGS IN THE MORNING AND AT BEDTIME. RINSE MOUTH AFTER USE   gabapentin 100 MG capsule Commonly known as: Neurontin Take 1 capsule (100 mg total) by mouth 3 (three) times daily for 14 days. For pain.   Juice Plus Fibre Liqd 2 (two) times daily. 1 VEGETABLE AND 1 FRUIT ONE BID   ondansetron 4 MG tablet Commonly known as: Zofran Take 1 tablet (4 mg total) by mouth  every 8 (eight) hours as needed for up to 7 days for nausea or vomiting.   oxyCODONE 5 MG immediate release tablet Commonly known as: Oxy IR/ROXICODONE Take 1-2 pills every 6 hrs as needed for severe pain, no more than 6 per day   sertraline 100 MG tablet Commonly known as: ZOLOFT Take 100 mg by mouth daily.   tranexamic acid 650 MG Tabs tablet Commonly known as: LYSTEDA Take 1,300 mg by mouth 3 (three) times daily as needed.   UNABLE TO FIND [employer]Terra ESSENTIAL OIL AFTER MEALS PRN        Follow-up Information     [facility] Follow up on 09/12/2022.   Specialty: Orthopedic Surgery Why: @ 1 pm,  For wound re-check Contact information: [facility] [address] [place] [address] [phone]                  Alfonse Alpers, Cordelia Poche 09/05/2022

## 2022-09-05 NOTE — Op Note (Signed)
Orthopaedic Surgery Operative Note (CSN: [id number])  Tracey Figueroa  October 27, 1965 Date of Surgery: 09/04/2022   Diagnoses:  Left radial head fracture, coronoid fracture and lateral ligament injury  Procedure: Left radial head replacement Left lateral ligament repair   Operative Finding Successful completion of the planned procedure.  Initially I felt that the radial head was the only injury however upon examination the patient's elbow was relatively unstable and was more consistent with a terrible triad injury.  We were able to perform a radial head replacement and assess the coronoid assessment assuming that it did not need to be repaired based on overall stability.  Lateral ligament repair was performed.  We will have to place the patient in the brace for stability purposes and she does have a higher than normal risk of instability of the radial head.  We did replacer the annular ligament as a separate layer.  Of note once the radial head fragments were removed we took the elbow through range of motion 0-110 was all that I can achieve though is unclear if this is related to body habitus or capsular contractures.  This is without a radial head implant in place and thus was not overstuffed.  Post-operative plan: The patient will be nonweightbearing in a splint transition to a hinged elbow brace for range of motion.  The patient will be admitted overnight secondary to block related hypoxia.  DVT prophylaxis not indicated in this ambulatory upper extremity patient without significant risk factors.   Pain control with PRN pain medication preferring oral medicines.  Follow up plan will be scheduled in approximately 7 days for incision check and XR.  Post-Op Diagnosis: Same Surgeons:Primary: Bjorn Pippin, MD Assistants:Caroline McBane PA-C Location: [facility] OR ROOM 1 Anesthesia: General with regional anesthesia Antibiotics: Ancef 2 g with local vancomycin powder 1 g at the surgical site Tourniquet  time: 35 Estimated Blood Loss: Minimal Complications: None Specimens: None Implants: Implant Name Type Inv. Item Serial No. Manufacturer Lot No. LRB No. Used Action  ANCH SUT NDL DX FBRTK STRL LF - [id number] Anchor ANCH SUT NDL DX FBRTK STRL LF  [employer] [id number] Left 1 Implanted  RADIAL STEM     [id number] Left 1 Implanted  HEAD RADIAL ALIGN - [id number] Head HEAD RADIAL ALIGN  SKELETAL [employer]YNAM[employer] [id number] Left 1 Implanted  ANCH SUT NDL DX FBRTK STRL LF - [id number] Anchor ANCH SUT NDL DX FBRTK STRL LF  [employer] [id number] Left 1 Implanted    Indications for Surgery:   Tracey Figueroa is a 57 y.o. female with radial head comminuted fracture and terrible triad type injury.  Benefits and risks of operative and nonoperative management were discussed prior to surgery with patient/guardian(s) and informed consent form was completed.  Specific risks including infection, need for additional surgery, postoperative arthrosis, instability amongst others   Procedure:   The patient was identified properly. Informed consent was obtained and the surgical site was marked. The patient was taken up to suite where general anesthesia was induced.  The patient was positioned supine on a hand table.  The left elbow was prepped and draped in the usual sterile fashion.  Timeout was performed before the beginning of the case.  Tourniquet was used for the above duration.  We began with a longitudinal approach to Kocher's interval.  Went to skin sharp achieving hemostasis we progressed.  We then identified the deep fascial layer which opened in line its fibers.  We able to expose the radial  head.  Annular ligament was separated.  Multiple radial head fragments were removed from the joint.  We took the elbow through range of motion.  Once we had cleared the joint we made an osteotomy of the radial neck to the specifications of skeletal dynamics.  At this point we used an opening canal finder and then  broaching system to size for a size 9 stem and 20 head.  Once we have tested this were happy with her overall construct we felt that the lateral ligaments were avulsed and the elbow was relatively unstable however the radial side and the radial collateral ligament seem to be the main issue rather than the coronoid as there was only a small tip fracture of the coronoid.  We then irrigated copiously and placed a final implants.  2 [employer] single loaded DX fiber tack anchors were used to repair the lateral ligament and the Kocher interval tissue and common extensors.  We placed vancomycin powder and ran the deep layer of fascia before closing the incision multilayer fashion with observable suture.  Sterile dressing and splint were placed.  Patient was woken taken to PACU in stable condition.  Alfonse Alpers, PA-C, present and scrubbed throughout the case, critical for completion in a timely fashion, and for retraction, instrumentation, closure.

## 2022-09-08 ENCOUNTER — Encounter (HOSPITAL_BASED_OUTPATIENT_CLINIC_OR_DEPARTMENT_OTHER): Payer: Self-pay | Admitting: Orthopaedic Surgery

## 2022-09-12 DIAGNOSIS — S52122D Displaced fracture of head of left radius, subsequent encounter for closed fracture with routine healing: Secondary | ICD-10-CM | POA: Diagnosis not present

## 2022-09-15 DIAGNOSIS — S52042D Displaced fracture of coronoid process of left ulna, subsequent encounter for closed fracture with routine healing: Secondary | ICD-10-CM | POA: Diagnosis not present

## 2022-09-15 DIAGNOSIS — S52302D Unspecified fracture of shaft of left radius, subsequent encounter for closed fracture with routine healing: Secondary | ICD-10-CM | POA: Diagnosis not present

## 2022-09-17 DIAGNOSIS — S52042D Displaced fracture of coronoid process of left ulna, subsequent encounter for closed fracture with routine healing: Secondary | ICD-10-CM | POA: Diagnosis not present

## 2022-09-17 DIAGNOSIS — S52302D Unspecified fracture of shaft of left radius, subsequent encounter for closed fracture with routine healing: Secondary | ICD-10-CM | POA: Diagnosis not present

## 2022-09-24 DIAGNOSIS — S52042D Displaced fracture of coronoid process of left ulna, subsequent encounter for closed fracture with routine healing: Secondary | ICD-10-CM | POA: Diagnosis not present

## 2022-09-24 DIAGNOSIS — S52302D Unspecified fracture of shaft of left radius, subsequent encounter for closed fracture with routine healing: Secondary | ICD-10-CM | POA: Diagnosis not present

## 2022-09-25 DIAGNOSIS — S52302D Unspecified fracture of shaft of left radius, subsequent encounter for closed fracture with routine healing: Secondary | ICD-10-CM | POA: Diagnosis not present

## 2022-09-25 DIAGNOSIS — S52042D Displaced fracture of coronoid process of left ulna, subsequent encounter for closed fracture with routine healing: Secondary | ICD-10-CM | POA: Diagnosis not present

## 2022-09-29 DIAGNOSIS — S52302D Unspecified fracture of shaft of left radius, subsequent encounter for closed fracture with routine healing: Secondary | ICD-10-CM | POA: Diagnosis not present

## 2022-09-29 DIAGNOSIS — S52042D Displaced fracture of coronoid process of left ulna, subsequent encounter for closed fracture with routine healing: Secondary | ICD-10-CM | POA: Diagnosis not present

## 2022-10-01 DIAGNOSIS — S52302D Unspecified fracture of shaft of left radius, subsequent encounter for closed fracture with routine healing: Secondary | ICD-10-CM | POA: Diagnosis not present

## 2022-10-01 DIAGNOSIS — S52042D Displaced fracture of coronoid process of left ulna, subsequent encounter for closed fracture with routine healing: Secondary | ICD-10-CM | POA: Diagnosis not present

## 2022-10-06 DIAGNOSIS — S52302D Unspecified fracture of shaft of left radius, subsequent encounter for closed fracture with routine healing: Secondary | ICD-10-CM | POA: Diagnosis not present

## 2022-10-06 DIAGNOSIS — S52042D Displaced fracture of coronoid process of left ulna, subsequent encounter for closed fracture with routine healing: Secondary | ICD-10-CM | POA: Diagnosis not present

## 2022-10-13 DIAGNOSIS — S52302D Unspecified fracture of shaft of left radius, subsequent encounter for closed fracture with routine healing: Secondary | ICD-10-CM | POA: Diagnosis not present

## 2022-10-13 DIAGNOSIS — S52042D Displaced fracture of coronoid process of left ulna, subsequent encounter for closed fracture with routine healing: Secondary | ICD-10-CM | POA: Diagnosis not present

## 2022-10-15 DIAGNOSIS — S52042D Displaced fracture of coronoid process of left ulna, subsequent encounter for closed fracture with routine healing: Secondary | ICD-10-CM | POA: Diagnosis not present

## 2022-10-15 DIAGNOSIS — S52302D Unspecified fracture of shaft of left radius, subsequent encounter for closed fracture with routine healing: Secondary | ICD-10-CM | POA: Diagnosis not present

## 2022-10-17 DIAGNOSIS — S52042D Displaced fracture of coronoid process of left ulna, subsequent encounter for closed fracture with routine healing: Secondary | ICD-10-CM | POA: Diagnosis not present

## 2022-10-20 DIAGNOSIS — S52042D Displaced fracture of coronoid process of left ulna, subsequent encounter for closed fracture with routine healing: Secondary | ICD-10-CM | POA: Diagnosis not present

## 2022-10-20 DIAGNOSIS — S52302D Unspecified fracture of shaft of left radius, subsequent encounter for closed fracture with routine healing: Secondary | ICD-10-CM | POA: Diagnosis not present

## 2022-10-22 DIAGNOSIS — S52302D Unspecified fracture of shaft of left radius, subsequent encounter for closed fracture with routine healing: Secondary | ICD-10-CM | POA: Diagnosis not present

## 2022-10-22 DIAGNOSIS — S52042D Displaced fracture of coronoid process of left ulna, subsequent encounter for closed fracture with routine healing: Secondary | ICD-10-CM | POA: Diagnosis not present

## 2022-10-27 DIAGNOSIS — S52302D Unspecified fracture of shaft of left radius, subsequent encounter for closed fracture with routine healing: Secondary | ICD-10-CM | POA: Diagnosis not present

## 2022-10-27 DIAGNOSIS — S52042D Displaced fracture of coronoid process of left ulna, subsequent encounter for closed fracture with routine healing: Secondary | ICD-10-CM | POA: Diagnosis not present

## 2022-10-29 DIAGNOSIS — S52042D Displaced fracture of coronoid process of left ulna, subsequent encounter for closed fracture with routine healing: Secondary | ICD-10-CM | POA: Diagnosis not present

## 2022-10-29 DIAGNOSIS — S52302D Unspecified fracture of shaft of left radius, subsequent encounter for closed fracture with routine healing: Secondary | ICD-10-CM | POA: Diagnosis not present

## 2022-11-06 DIAGNOSIS — S52302D Unspecified fracture of shaft of left radius, subsequent encounter for closed fracture with routine healing: Secondary | ICD-10-CM | POA: Diagnosis not present

## 2022-11-06 DIAGNOSIS — S52042D Displaced fracture of coronoid process of left ulna, subsequent encounter for closed fracture with routine healing: Secondary | ICD-10-CM | POA: Diagnosis not present

## 2022-11-13 DIAGNOSIS — S52042D Displaced fracture of coronoid process of left ulna, subsequent encounter for closed fracture with routine healing: Secondary | ICD-10-CM | POA: Diagnosis not present

## 2022-11-13 DIAGNOSIS — S52302D Unspecified fracture of shaft of left radius, subsequent encounter for closed fracture with routine healing: Secondary | ICD-10-CM | POA: Diagnosis not present

## 2022-11-19 DIAGNOSIS — S52042D Displaced fracture of coronoid process of left ulna, subsequent encounter for closed fracture with routine healing: Secondary | ICD-10-CM | POA: Diagnosis not present

## 2022-11-19 DIAGNOSIS — S52302D Unspecified fracture of shaft of left radius, subsequent encounter for closed fracture with routine healing: Secondary | ICD-10-CM | POA: Diagnosis not present

## 2022-11-24 DIAGNOSIS — M75111 Incomplete rotator cuff tear or rupture of right shoulder, not specified as traumatic: Secondary | ICD-10-CM | POA: Diagnosis not present

## 2022-11-24 DIAGNOSIS — S46011A Strain of muscle(s) and tendon(s) of the rotator cuff of right shoulder, initial encounter: Secondary | ICD-10-CM | POA: Diagnosis not present

## 2022-11-24 DIAGNOSIS — G8918 Other acute postprocedural pain: Secondary | ICD-10-CM | POA: Diagnosis not present

## 2022-11-24 DIAGNOSIS — M7551 Bursitis of right shoulder: Secondary | ICD-10-CM | POA: Diagnosis not present

## 2022-11-24 DIAGNOSIS — Y999 Unspecified external cause status: Secondary | ICD-10-CM | POA: Diagnosis not present

## 2022-11-24 DIAGNOSIS — X58XXXA Exposure to other specified factors, initial encounter: Secondary | ICD-10-CM | POA: Diagnosis not present

## 2022-11-24 DIAGNOSIS — S43431A Superior glenoid labrum lesion of right shoulder, initial encounter: Secondary | ICD-10-CM | POA: Diagnosis not present

## 2022-11-24 DIAGNOSIS — M7581 Other shoulder lesions, right shoulder: Secondary | ICD-10-CM | POA: Diagnosis not present

## 2022-11-24 DIAGNOSIS — M24111 Other articular cartilage disorders, right shoulder: Secondary | ICD-10-CM | POA: Diagnosis not present

## 2022-11-24 DIAGNOSIS — M19011 Primary osteoarthritis, right shoulder: Secondary | ICD-10-CM | POA: Diagnosis not present

## 2022-11-24 DIAGNOSIS — M25811 Other specified joint disorders, right shoulder: Secondary | ICD-10-CM | POA: Diagnosis not present

## 2022-11-28 DIAGNOSIS — M7541 Impingement syndrome of right shoulder: Secondary | ICD-10-CM | POA: Diagnosis not present

## 2022-11-28 DIAGNOSIS — M7521 Bicipital tendinitis, right shoulder: Secondary | ICD-10-CM | POA: Diagnosis not present

## 2022-11-28 DIAGNOSIS — M25511 Pain in right shoulder: Secondary | ICD-10-CM | POA: Diagnosis not present

## 2022-11-28 DIAGNOSIS — M75111 Incomplete rotator cuff tear or rupture of right shoulder, not specified as traumatic: Secondary | ICD-10-CM | POA: Diagnosis not present

## 2022-12-02 DIAGNOSIS — M25511 Pain in right shoulder: Secondary | ICD-10-CM | POA: Diagnosis not present

## 2022-12-02 DIAGNOSIS — M7521 Bicipital tendinitis, right shoulder: Secondary | ICD-10-CM | POA: Diagnosis not present

## 2022-12-02 DIAGNOSIS — M75111 Incomplete rotator cuff tear or rupture of right shoulder, not specified as traumatic: Secondary | ICD-10-CM | POA: Diagnosis not present

## 2022-12-02 DIAGNOSIS — M7541 Impingement syndrome of right shoulder: Secondary | ICD-10-CM | POA: Diagnosis not present

## 2022-12-04 DIAGNOSIS — M7521 Bicipital tendinitis, right shoulder: Secondary | ICD-10-CM | POA: Diagnosis not present

## 2022-12-04 DIAGNOSIS — M75111 Incomplete rotator cuff tear or rupture of right shoulder, not specified as traumatic: Secondary | ICD-10-CM | POA: Diagnosis not present

## 2022-12-04 DIAGNOSIS — M7541 Impingement syndrome of right shoulder: Secondary | ICD-10-CM | POA: Diagnosis not present

## 2022-12-04 DIAGNOSIS — M25511 Pain in right shoulder: Secondary | ICD-10-CM | POA: Diagnosis not present

## 2022-12-09 DIAGNOSIS — M25511 Pain in right shoulder: Secondary | ICD-10-CM | POA: Diagnosis not present

## 2022-12-09 DIAGNOSIS — M7521 Bicipital tendinitis, right shoulder: Secondary | ICD-10-CM | POA: Diagnosis not present

## 2022-12-09 DIAGNOSIS — M75111 Incomplete rotator cuff tear or rupture of right shoulder, not specified as traumatic: Secondary | ICD-10-CM | POA: Diagnosis not present

## 2022-12-09 DIAGNOSIS — M7541 Impingement syndrome of right shoulder: Secondary | ICD-10-CM | POA: Diagnosis not present

## 2022-12-11 DIAGNOSIS — M75111 Incomplete rotator cuff tear or rupture of right shoulder, not specified as traumatic: Secondary | ICD-10-CM | POA: Diagnosis not present

## 2022-12-11 DIAGNOSIS — M7541 Impingement syndrome of right shoulder: Secondary | ICD-10-CM | POA: Diagnosis not present

## 2022-12-11 DIAGNOSIS — M25511 Pain in right shoulder: Secondary | ICD-10-CM | POA: Diagnosis not present

## 2022-12-11 DIAGNOSIS — M7521 Bicipital tendinitis, right shoulder: Secondary | ICD-10-CM | POA: Diagnosis not present

## 2022-12-17 DIAGNOSIS — M7541 Impingement syndrome of right shoulder: Secondary | ICD-10-CM | POA: Diagnosis not present

## 2022-12-17 DIAGNOSIS — M25511 Pain in right shoulder: Secondary | ICD-10-CM | POA: Diagnosis not present

## 2022-12-17 DIAGNOSIS — M75111 Incomplete rotator cuff tear or rupture of right shoulder, not specified as traumatic: Secondary | ICD-10-CM | POA: Diagnosis not present

## 2022-12-17 DIAGNOSIS — M7521 Bicipital tendinitis, right shoulder: Secondary | ICD-10-CM | POA: Diagnosis not present

## 2022-12-22 DIAGNOSIS — M75111 Incomplete rotator cuff tear or rupture of right shoulder, not specified as traumatic: Secondary | ICD-10-CM | POA: Diagnosis not present

## 2022-12-22 DIAGNOSIS — M25511 Pain in right shoulder: Secondary | ICD-10-CM | POA: Diagnosis not present

## 2022-12-22 DIAGNOSIS — M7541 Impingement syndrome of right shoulder: Secondary | ICD-10-CM | POA: Diagnosis not present

## 2022-12-22 DIAGNOSIS — M7521 Bicipital tendinitis, right shoulder: Secondary | ICD-10-CM | POA: Diagnosis not present

## 2022-12-24 DIAGNOSIS — M25511 Pain in right shoulder: Secondary | ICD-10-CM | POA: Diagnosis not present

## 2022-12-24 DIAGNOSIS — M7541 Impingement syndrome of right shoulder: Secondary | ICD-10-CM | POA: Diagnosis not present

## 2022-12-24 DIAGNOSIS — M7521 Bicipital tendinitis, right shoulder: Secondary | ICD-10-CM | POA: Diagnosis not present

## 2022-12-24 DIAGNOSIS — M75111 Incomplete rotator cuff tear or rupture of right shoulder, not specified as traumatic: Secondary | ICD-10-CM | POA: Diagnosis not present

## 2022-12-29 DIAGNOSIS — M75111 Incomplete rotator cuff tear or rupture of right shoulder, not specified as traumatic: Secondary | ICD-10-CM | POA: Diagnosis not present

## 2022-12-29 DIAGNOSIS — M7541 Impingement syndrome of right shoulder: Secondary | ICD-10-CM | POA: Diagnosis not present

## 2022-12-29 DIAGNOSIS — M25511 Pain in right shoulder: Secondary | ICD-10-CM | POA: Diagnosis not present

## 2022-12-29 DIAGNOSIS — M7521 Bicipital tendinitis, right shoulder: Secondary | ICD-10-CM | POA: Diagnosis not present

## 2022-12-31 DIAGNOSIS — M7541 Impingement syndrome of right shoulder: Secondary | ICD-10-CM | POA: Diagnosis not present

## 2022-12-31 DIAGNOSIS — M25511 Pain in right shoulder: Secondary | ICD-10-CM | POA: Diagnosis not present

## 2022-12-31 DIAGNOSIS — M7521 Bicipital tendinitis, right shoulder: Secondary | ICD-10-CM | POA: Diagnosis not present

## 2022-12-31 DIAGNOSIS — M75111 Incomplete rotator cuff tear or rupture of right shoulder, not specified as traumatic: Secondary | ICD-10-CM | POA: Diagnosis not present

## 2023-01-05 DIAGNOSIS — M7541 Impingement syndrome of right shoulder: Secondary | ICD-10-CM | POA: Diagnosis not present

## 2023-01-05 DIAGNOSIS — M7521 Bicipital tendinitis, right shoulder: Secondary | ICD-10-CM | POA: Diagnosis not present

## 2023-01-05 DIAGNOSIS — M25511 Pain in right shoulder: Secondary | ICD-10-CM | POA: Diagnosis not present

## 2023-01-05 DIAGNOSIS — M75111 Incomplete rotator cuff tear or rupture of right shoulder, not specified as traumatic: Secondary | ICD-10-CM | POA: Diagnosis not present

## 2023-01-07 DIAGNOSIS — M7521 Bicipital tendinitis, right shoulder: Secondary | ICD-10-CM | POA: Diagnosis not present

## 2023-01-07 DIAGNOSIS — M7541 Impingement syndrome of right shoulder: Secondary | ICD-10-CM | POA: Diagnosis not present

## 2023-01-07 DIAGNOSIS — M75111 Incomplete rotator cuff tear or rupture of right shoulder, not specified as traumatic: Secondary | ICD-10-CM | POA: Diagnosis not present

## 2023-01-07 DIAGNOSIS — M25511 Pain in right shoulder: Secondary | ICD-10-CM | POA: Diagnosis not present

## 2023-01-12 DIAGNOSIS — M75111 Incomplete rotator cuff tear or rupture of right shoulder, not specified as traumatic: Secondary | ICD-10-CM | POA: Diagnosis not present

## 2023-01-12 DIAGNOSIS — M7541 Impingement syndrome of right shoulder: Secondary | ICD-10-CM | POA: Diagnosis not present

## 2023-01-12 DIAGNOSIS — M7521 Bicipital tendinitis, right shoulder: Secondary | ICD-10-CM | POA: Diagnosis not present

## 2023-01-12 DIAGNOSIS — M25511 Pain in right shoulder: Secondary | ICD-10-CM | POA: Diagnosis not present

## 2023-01-19 DIAGNOSIS — M75111 Incomplete rotator cuff tear or rupture of right shoulder, not specified as traumatic: Secondary | ICD-10-CM | POA: Diagnosis not present

## 2023-01-19 DIAGNOSIS — M7541 Impingement syndrome of right shoulder: Secondary | ICD-10-CM | POA: Diagnosis not present

## 2023-01-19 DIAGNOSIS — M25511 Pain in right shoulder: Secondary | ICD-10-CM | POA: Diagnosis not present

## 2023-01-19 DIAGNOSIS — M7521 Bicipital tendinitis, right shoulder: Secondary | ICD-10-CM | POA: Diagnosis not present

## 2023-01-22 DIAGNOSIS — M25511 Pain in right shoulder: Secondary | ICD-10-CM | POA: Diagnosis not present

## 2023-01-22 DIAGNOSIS — M7541 Impingement syndrome of right shoulder: Secondary | ICD-10-CM | POA: Diagnosis not present

## 2023-01-22 DIAGNOSIS — M75111 Incomplete rotator cuff tear or rupture of right shoulder, not specified as traumatic: Secondary | ICD-10-CM | POA: Diagnosis not present

## 2023-01-22 DIAGNOSIS — M7521 Bicipital tendinitis, right shoulder: Secondary | ICD-10-CM | POA: Diagnosis not present

## 2023-01-26 DIAGNOSIS — M7541 Impingement syndrome of right shoulder: Secondary | ICD-10-CM | POA: Diagnosis not present

## 2023-01-26 DIAGNOSIS — M25511 Pain in right shoulder: Secondary | ICD-10-CM | POA: Diagnosis not present

## 2023-01-26 DIAGNOSIS — M7521 Bicipital tendinitis, right shoulder: Secondary | ICD-10-CM | POA: Diagnosis not present

## 2023-01-26 DIAGNOSIS — M75111 Incomplete rotator cuff tear or rupture of right shoulder, not specified as traumatic: Secondary | ICD-10-CM | POA: Diagnosis not present

## 2023-02-04 DIAGNOSIS — M7541 Impingement syndrome of right shoulder: Secondary | ICD-10-CM | POA: Diagnosis not present

## 2023-02-04 DIAGNOSIS — M25511 Pain in right shoulder: Secondary | ICD-10-CM | POA: Diagnosis not present

## 2023-02-04 DIAGNOSIS — M75111 Incomplete rotator cuff tear or rupture of right shoulder, not specified as traumatic: Secondary | ICD-10-CM | POA: Diagnosis not present

## 2023-02-04 DIAGNOSIS — M7521 Bicipital tendinitis, right shoulder: Secondary | ICD-10-CM | POA: Diagnosis not present

## 2023-02-16 DIAGNOSIS — M75111 Incomplete rotator cuff tear or rupture of right shoulder, not specified as traumatic: Secondary | ICD-10-CM | POA: Diagnosis not present

## 2023-02-16 DIAGNOSIS — M7521 Bicipital tendinitis, right shoulder: Secondary | ICD-10-CM | POA: Diagnosis not present

## 2023-02-16 DIAGNOSIS — M25511 Pain in right shoulder: Secondary | ICD-10-CM | POA: Diagnosis not present

## 2023-02-16 DIAGNOSIS — M7541 Impingement syndrome of right shoulder: Secondary | ICD-10-CM | POA: Diagnosis not present

## 2023-02-18 DIAGNOSIS — M25511 Pain in right shoulder: Secondary | ICD-10-CM | POA: Diagnosis not present

## 2023-02-18 DIAGNOSIS — M7521 Bicipital tendinitis, right shoulder: Secondary | ICD-10-CM | POA: Diagnosis not present

## 2023-02-18 DIAGNOSIS — M75111 Incomplete rotator cuff tear or rupture of right shoulder, not specified as traumatic: Secondary | ICD-10-CM | POA: Diagnosis not present

## 2023-02-18 DIAGNOSIS — M7541 Impingement syndrome of right shoulder: Secondary | ICD-10-CM | POA: Diagnosis not present

## 2023-02-24 DIAGNOSIS — M7521 Bicipital tendinitis, right shoulder: Secondary | ICD-10-CM | POA: Diagnosis not present

## 2023-02-24 DIAGNOSIS — M75111 Incomplete rotator cuff tear or rupture of right shoulder, not specified as traumatic: Secondary | ICD-10-CM | POA: Diagnosis not present

## 2023-02-24 DIAGNOSIS — M7541 Impingement syndrome of right shoulder: Secondary | ICD-10-CM | POA: Diagnosis not present

## 2023-02-24 DIAGNOSIS — M25511 Pain in right shoulder: Secondary | ICD-10-CM | POA: Diagnosis not present
